# Patient Record
Sex: Male | Born: 1965 | Race: White | Hispanic: No | Smoking: Never smoker
Health system: Southern US, Community
[De-identification: ages and names within clinical notes are randomized; demographics above are authoritative.]

## PROBLEM LIST (undated history)

## (undated) VITALS — BP 128/95 | HR 89 | Temp 98.2°F | Resp 18

## (undated) DIAGNOSIS — M51369 Other intervertebral disc degeneration, lumbar region without mention of lumbar back pain or lower extremity pain: Secondary | ICD-10-CM

## (undated) DIAGNOSIS — M5136 Other intervertebral disc degeneration, lumbar region: Secondary | ICD-10-CM

## (undated) DIAGNOSIS — M5126 Other intervertebral disc displacement, lumbar region: Secondary | ICD-10-CM

## (undated) DIAGNOSIS — F419 Anxiety disorder, unspecified: Secondary | ICD-10-CM

## (undated) DIAGNOSIS — I10 Essential (primary) hypertension: Secondary | ICD-10-CM

## (undated) DIAGNOSIS — M549 Dorsalgia, unspecified: Secondary | ICD-10-CM

## (undated) DIAGNOSIS — F32A Depression, unspecified: Secondary | ICD-10-CM

---

## 2019-04-10 ENCOUNTER — Other Ambulatory Visit: Payer: Self-pay

## 2019-04-10 ENCOUNTER — Encounter (HOSPITAL_COMMUNITY): Payer: Self-pay | Admitting: Obstetrics and Gynecology

## 2019-04-10 ENCOUNTER — Emergency Department (HOSPITAL_COMMUNITY)
Admission: EM | Admit: 2019-04-10 | Discharge: 2019-04-10 | Disposition: A | Discharge status: Home / Self Care | Payer: Medicaid Other | Attending: Emergency Medicine | Admitting: Emergency Medicine

## 2019-04-10 DIAGNOSIS — I10 Essential (primary) hypertension: Secondary | ICD-10-CM | POA: Diagnosis not present

## 2019-04-10 DIAGNOSIS — F1520 Other stimulant dependence, uncomplicated: Secondary | ICD-10-CM | POA: Insufficient documentation

## 2019-04-10 DIAGNOSIS — F1510 Other stimulant abuse, uncomplicated: Secondary | ICD-10-CM

## 2019-04-10 HISTORY — DX: Other intervertebral disc displacement, lumbar region: M51.26

## 2019-04-10 HISTORY — DX: Other intervertebral disc degeneration, lumbar region without mention of lumbar back pain or lower extremity pain: M51.369

## 2019-04-10 HISTORY — DX: Other intervertebral disc degeneration, lumbar region: M51.36

## 2019-04-10 HISTORY — DX: Dorsalgia, unspecified: M54.9

## 2019-04-10 HISTORY — DX: Essential (primary) hypertension: I10

## 2019-04-10 LAB — COMPREHENSIVE METABOLIC PANEL
ALT: 12 U/L (ref 0–44)
AST: 15 U/L (ref 15–41)
Albumin: 3.9 g/dL (ref 3.5–5.0)
Alkaline Phosphatase: 86 U/L (ref 38–126)
Anion gap: 8 (ref 5–15)
BUN: 17 mg/dL (ref 6–20)
CO2: 26 mmol/L (ref 22–32)
Calcium: 8.7 mg/dL — ABNORMAL LOW (ref 8.9–10.3)
Chloride: 106 mmol/L (ref 98–111)
Creatinine, Ser: 1.28 mg/dL — ABNORMAL HIGH (ref 0.61–1.24)
GFR calc Af Amer: >60 mL/min (ref >60)
GFR calc non Af Amer: >60 mL/min (ref >60)
Glucose, Bld: 97 mg/dL (ref 70–99)
Potassium: 3.6 mmol/L (ref 3.5–5.1)
Sodium: 140 mmol/L (ref 135–145)
Total Bilirubin: 0.7 mg/dL (ref 0.3–1.2)
Total Protein: 8 g/dL (ref 6.5–8.1)

## 2019-04-10 LAB — CBC
HCT: 39.1 % (ref 39.0–52.0)
Hemoglobin: 12.5 g/dL — ABNORMAL LOW (ref 13.0–17.0)
MCH: 29.7 pg (ref 26.0–34.0)
MCHC: 32 g/dL (ref 30.0–36.0)
MCV: 92.9 fL (ref 80.0–100.0)
Platelets: 333 10*3/uL (ref 150–400)
RBC: 4.21 MIL/uL — ABNORMAL LOW (ref 4.22–5.81)
RDW: 13.7 % (ref 11.5–15.5)
WBC: 8 10*3/uL (ref 4.0–10.5)
nRBC: 0 % (ref 0.0–0.2)

## 2019-04-10 LAB — ETHANOL: Alcohol, Ethyl (B): <10 mg/dL (ref <10)

## 2019-04-10 NOTE — ED Notes (Signed)
Pt aware urine sample is needed 

## 2019-04-10 NOTE — Discharge Instructions (Addendum)
It was our pleasure to provide your ER care today - we hope that you feel better.  We made a referral to our peer support program. Also see information provided for additional community resources for rehab, shelters, etc.   Return to ER if worse, new symptoms, fevers, other concern.

## 2019-04-10 NOTE — ED Notes (Signed)
An After Visit Summary was printed and given to the patient. Discharge instructions and resources for drug abuse/rehab given to patient, patient has no further questions at this time. Patient alert and oriented x4 and ambulatory.
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
## 2019-04-10 NOTE — ED Triage Notes (Signed)
Pt reports he is from [place] and uses Meth. Pt reports he last used yesterday and has been up for 4 days. Pt reports he got some rest last night.  Pt reports he needs detox and that he has pain management doctor for morphine 30mg  pain medications for his back

## 2019-04-17 DIAGNOSIS — F1110 Opioid abuse, uncomplicated: Secondary | ICD-10-CM | POA: Insufficient documentation

## 2019-04-17 DIAGNOSIS — F1310 Sedative, hypnotic or anxiolytic abuse, uncomplicated: Secondary | ICD-10-CM | POA: Insufficient documentation

## 2019-05-31 DIAGNOSIS — F330 Major depressive disorder, recurrent, mild: Secondary | ICD-10-CM | POA: Insufficient documentation

## 2019-09-05 ENCOUNTER — Encounter (HOSPITAL_COMMUNITY): Payer: Self-pay

## 2019-09-05 ENCOUNTER — Emergency Department (HOSPITAL_COMMUNITY)
Admission: EM | Admit: 2019-09-05 | Discharge: 2019-09-06 | Disposition: A | Discharge status: Other Acute Inpatient Hospital | Payer: Medicaid Other | Source: Home / Self Care | Attending: Emergency Medicine | Admitting: Emergency Medicine

## 2019-09-05 ENCOUNTER — Emergency Department (HOSPITAL_COMMUNITY): Payer: Medicaid Other

## 2019-09-05 ENCOUNTER — Other Ambulatory Visit: Payer: Self-pay

## 2019-09-05 DIAGNOSIS — F332 Major depressive disorder, recurrent severe without psychotic features: Secondary | ICD-10-CM | POA: Insufficient documentation

## 2019-09-05 DIAGNOSIS — R45850 Homicidal ideations: Secondary | ICD-10-CM | POA: Insufficient documentation

## 2019-09-05 DIAGNOSIS — Z20828 Contact with and (suspected) exposure to other viral communicable diseases: Secondary | ICD-10-CM | POA: Insufficient documentation

## 2019-09-05 DIAGNOSIS — Z79899 Other long term (current) drug therapy: Secondary | ICD-10-CM | POA: Insufficient documentation

## 2019-09-05 DIAGNOSIS — R1013 Epigastric pain: Secondary | ICD-10-CM

## 2019-09-05 DIAGNOSIS — R1030 Lower abdominal pain, unspecified: Secondary | ICD-10-CM | POA: Insufficient documentation

## 2019-09-05 DIAGNOSIS — I10 Essential (primary) hypertension: Secondary | ICD-10-CM | POA: Insufficient documentation

## 2019-09-05 DIAGNOSIS — R45851 Suicidal ideations: Secondary | ICD-10-CM | POA: Insufficient documentation

## 2019-09-05 DIAGNOSIS — R4589 Other symptoms and signs involving emotional state: Secondary | ICD-10-CM

## 2019-09-05 LAB — RAPID URINE DRUG SCREEN, HOSP PERFORMED
Amphetamines: NOT DETECTED
Barbiturates: NOT DETECTED
Benzodiazepines: NOT DETECTED
Cocaine: NOT DETECTED
Opiates: POSITIVE — AB
Tetrahydrocannabinol: NOT DETECTED

## 2019-09-05 LAB — CBC
HCT: 46.1 % (ref 39.0–52.0)
Hemoglobin: 15.6 g/dL (ref 13.0–17.0)
MCH: 31.1 pg (ref 26.0–34.0)
MCHC: 33.8 g/dL (ref 30.0–36.0)
MCV: 91.8 fL (ref 80.0–100.0)
Platelets: 473 10*3/uL — ABNORMAL HIGH (ref 150–400)
RBC: 5.02 MIL/uL (ref 4.22–5.81)
RDW: 13.2 % (ref 11.5–15.5)
WBC: 14.7 10*3/uL — ABNORMAL HIGH (ref 4.0–10.5)
nRBC: 0 % (ref 0.0–0.2)

## 2019-09-05 LAB — URINALYSIS, ROUTINE W REFLEX MICROSCOPIC
Bacteria, UA: NONE SEEN
Bilirubin Urine: NEGATIVE
Glucose, UA: >=500 mg/dL — AB
Ketones, ur: 5 mg/dL — AB
Leukocytes,Ua: NEGATIVE
Nitrite: NEGATIVE
Protein, ur: NEGATIVE mg/dL
Specific Gravity, Urine: 1.03 (ref 1.005–1.030)
pH: 5 (ref 5.0–8.0)

## 2019-09-05 LAB — COMPREHENSIVE METABOLIC PANEL
ALT: 12 U/L (ref 0–44)
AST: 12 U/L — ABNORMAL LOW (ref 15–41)
Albumin: 4.3 g/dL (ref 3.5–5.0)
Alkaline Phosphatase: 53 U/L (ref 38–126)
Anion gap: 10 (ref 5–15)
BUN: 30 mg/dL — ABNORMAL HIGH (ref 6–20)
CO2: 28 mmol/L (ref 22–32)
Calcium: 9.4 mg/dL (ref 8.9–10.3)
Chloride: 99 mmol/L (ref 98–111)
Creatinine, Ser: 1.94 mg/dL — ABNORMAL HIGH (ref 0.61–1.24)
GFR calc Af Amer: 44 mL/min — ABNORMAL LOW (ref >60)
GFR calc non Af Amer: 38 mL/min — ABNORMAL LOW (ref >60)
Glucose, Bld: 117 mg/dL — ABNORMAL HIGH (ref 70–99)
Potassium: 4.1 mmol/L (ref 3.5–5.1)
Sodium: 137 mmol/L (ref 135–145)
Total Bilirubin: 0.9 mg/dL (ref 0.3–1.2)
Total Protein: 7.9 g/dL (ref 6.5–8.1)

## 2019-09-05 LAB — ETHANOL: Alcohol, Ethyl (B): <10 mg/dL (ref <10)

## 2019-09-05 LAB — POC SARS CORONAVIRUS 2 AG -  ED: SARS Coronavirus 2 Ag: NEGATIVE

## 2019-09-05 LAB — LIPASE, BLOOD: Lipase: 28 U/L (ref 11–51)

## 2019-09-05 MED ORDER — SODIUM CHLORIDE 0.9 % IV BOLUS
1000.0000 mL | Freq: Once | INTRAVENOUS | Status: AC
  Administered 2019-09-05: 1000 mL via INTRAVENOUS

## 2019-09-05 MED ORDER — HYDROMORPHONE HCL 1 MG/ML IJ SOLN
1.0000 mg | Freq: Once | INTRAMUSCULAR | Status: AC
  Administered 2019-09-05: 1 mg via INTRAVENOUS
  Filled 2019-09-05: qty 1

## 2019-09-05 MED ORDER — ONDANSETRON 4 MG PO TBDP
4.0000 mg | ORAL_TABLET | Freq: Once | ORAL | Status: AC
  Administered 2019-09-05: 4 mg via ORAL
  Filled 2019-09-05: qty 1

## 2019-09-05 MED ORDER — SODIUM CHLORIDE 0.9% FLUSH
3.0000 mL | Freq: Once | INTRAVENOUS | Status: AC
  Administered 2019-09-05: 3 mL via INTRAVENOUS

## 2019-09-05 MED ORDER — IOHEXOL 300 MG/ML  SOLN
100.0000 mL | Freq: Once | INTRAMUSCULAR | Status: AC | PRN
  Administered 2019-09-05: 80 mL via INTRAVENOUS

## 2019-09-05 NOTE — ED Notes (Signed)
Pt aware urine sample is needed and pt was provided a urinal.  Pt requesting we check his renal fx prior to doing CT and requested to speak with a psychiatrist due to some SI/unsafe feelings.  Advised I would let his nurse know.  RN Garnet Sierras. Notified.

## 2019-09-05 NOTE — ED Triage Notes (Addendum)
Pt c/o LLQ abd pain. Pt states that he has had nausea and diarrhea. Pt concerned for diverticulitis.

## 2019-09-05 NOTE — BH Assessment (Signed)
Clinician called pt's nurse in an attempt to talk to pt to complete their Mission Assessment, but no one picked up the phone. Will attempt again at a later time.

## 2019-09-05 NOTE — ED Provider Notes (Signed)
[facility] DEPT Provider Note   CSN: [id number] Arrival date & time: 09/05/19  1309     History   Chief Complaint Chief Complaint  Patient presents with   Abdominal Pain    HPI Tim Morales is a 53 y.o. male.     Patient presents with lower abdominal discomfort and some nausea..    The history is provided by the patient. No language interpreter was used.  Abdominal Pain Pain location:  Suprapubic Pain quality: aching   Pain radiates to:  Does not radiate Pain severity:  Moderate Onset quality:  Sudden Timing:  Constant Progression:  Worsening Chronicity:  New Context: not alcohol use   Relieved by:  Nothing Worsened by:  Nothing Ineffective treatments:  None tried Associated symptoms: no chest pain, no cough, no diarrhea, no fatigue and no hematuria     Past Medical History:  Diagnosis Date   Back pain    Bulging lumbar disc    Hypertension     There are no active problems to display for this patient.   History reviewed. No pertinent surgical history.      Home Medications    Prior to Admission medications   Medication Sig Start Date End Date Taking? Authorizing Provider  lisinopril (ZESTRIL) 10 MG tablet Take 20 mg by mouth daily. 04/23/19  Yes [provider]  QUEtiapine (SEROQUEL) 100 MG tablet Take 100 mg by mouth at bedtime.   Yes [provider]    Family History History reviewed. No pertinent family history.  Social History Social History   Tobacco Use   Smoking status: Never Smoker   Smokeless tobacco: Never Used  Substance Use Topics   Alcohol use: Yes    Comment: Social   Drug use: Yes    Types: Methamphetamines     Allergies   Patient has no known allergies.   Review of Systems Review of Systems  Constitutional: Negative for appetite change and fatigue.  HENT: Negative for congestion, ear discharge and sinus pressure.   Eyes: Negative for discharge.  Respiratory:  Negative for cough.   Cardiovascular: Negative for chest pain.  Gastrointestinal: Positive for abdominal pain. Negative for diarrhea.  Genitourinary: Negative for frequency and hematuria.  Musculoskeletal: Negative for back pain.  Skin: Negative for rash.  Neurological: Negative for seizures and headaches.  Psychiatric/Behavioral: Negative for hallucinations.     Physical Exam Updated Vital Signs BP (!) 158/111    Pulse 97    Temp 98.2 F (36.8 C) (Oral)    Resp 16    SpO2 99%   Physical Exam Vitals signs and nursing note reviewed.  Constitutional:      Appearance: He is well-developed.  HENT:     Head: Normocephalic.     Nose: Nose normal.  Eyes:     General: No scleral icterus.    Conjunctiva/sclera: Conjunctivae normal.  Neck:     Musculoskeletal: Neck supple.     Thyroid: No thyromegaly.  Cardiovascular:     Rate and Rhythm: Normal rate and regular rhythm.     Heart sounds: No murmur. No friction rub. No gallop.   Pulmonary:     Breath sounds: No stridor. No wheezing or rales.  Chest:     Chest wall: No tenderness.  Abdominal:     General: There is no distension.     Tenderness: There is abdominal tenderness. There is no rebound.  Musculoskeletal: Normal range of motion.  Lymphadenopathy:     Cervical: No cervical  adenopathy.  Skin:    Findings: No erythema or rash.  Neurological:     Mental Status: He is oriented to person, place, and time.     Motor: No abnormal muscle tone.     Coordination: Coordination normal.  Psychiatric:        Behavior: Behavior normal.      ED Treatments / Results  Labs (all labs ordered are listed, but only abnormal results are displayed) Labs Reviewed  COMPREHENSIVE METABOLIC PANEL - Abnormal; Notable for the following components:      Result Value   Glucose, Bld 117 (*)    BUN 30 (*)    Creatinine, Ser 1.94 (*)    AST 12 (*)    GFR calc non Af Amer 38 (*)    GFR calc Af Amer 44 (*)    All other components within  normal limits  CBC - Abnormal; Notable for the following components:   WBC 14.7 (*)    Platelets 473 (*)    All other components within normal limits  URINALYSIS, ROUTINE W REFLEX MICROSCOPIC - Abnormal; Notable for the following components:   Glucose, UA >=500 (*)    Hgb urine dipstick SMALL (*)    Ketones, ur 5 (*)    All other components within normal limits  LIPASE, BLOOD  ETHANOL  RAPID URINE DRUG SCREEN, HOSP PERFORMED  POC SARS CORONAVIRUS 2 AG -  ED    EKG None  Radiology Ct Abdomen Pelvis W Contrast  Result Date: 09/05/2019 CLINICAL DATA:  Left lower quadrant pain and abdominal distension EXAM: CT ABDOMEN AND PELVIS WITH CONTRAST TECHNIQUE: Multidetector CT imaging of the abdomen and pelvis was performed using the standard protocol following bolus administration of intravenous contrast. CONTRAST:  64mL OMNIPAQUE IOHEXOL 300 MG/ML  SOLN COMPARISON:  None. FINDINGS: Lower chest: Mild atelectatic changes are noted in the bases bilaterally. No sizable effusion is seen. Small sliding-type hiatal hernia is noted. Hepatobiliary: No focal liver abnormality is seen. No gallstones, gallbladder wall thickening, or biliary dilatation. Pancreas: Unremarkable. No pancreatic ductal dilatation or surrounding inflammatory changes. Spleen: Normal in size without focal abnormality. Adrenals/Urinary Tract: Adrenal glands are within normal limits. The kidneys demonstrate a normal enhancement pattern bilaterally. Delayed images demonstrate normal excretion of contrast. Small peripelvic cysts are noted on the left. No renal calculi or obstructive changes are seen. The bladder is partially distended. Stomach/Bowel: Diverticular changes noted within the colon. No definitive changes of diverticulitis are seen. The appendix is within normal limits. Small bowel shows no obstructive or inflammatory changes. The stomach is within normal limits aside from the previously mentioned hiatal hernia. Vascular/Lymphatic:  Aortic atherosclerosis. No enlarged abdominal or pelvic lymph nodes. Reproductive: Prostate is unremarkable. Other: No abdominal wall hernia or abnormality. No abdominopelvic ascites. 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 No other focal abnormality is noted. IMPRESSION: Diverticular change without evidence of diverticulitis. Chronic changes as described above without acute abnormality. Electronically Signed   By: Alcide Clever M.D.   On: 09/05/2019 19:40    Procedures Procedures (including critical care time)  Medications Ordered in ED Medications  sodium chloride flush (NS) 0.9 % injection 3 mL (3 mLs Intravenous Given 09/05/19 1709)  sodium chloride 0.9 % bolus 1,000 mL (0 mLs Intravenous Stopped 09/05/19 1834)  HYDROmorphone (DILAUDID) injection 1 mg (1 mg Intravenous Given 09/05/19 1704)  ondansetron (ZOFRAN-ODT) disintegrating tablet 4 mg (4 mg Oral Given 09/05/19 1704)  iohexol (OMNIPAQUE) 300 MG/ML solution 100 mL (  80 mLs Intravenous Contrast Given 09/05/19 1914)  HYDROmorphone (DILAUDID) injection 1 mg (1 mg Intravenous Given 09/05/19 2004)     Initial Impression / Assessment and Plan / ED Course  I have reviewed the triage vital signs and the nursing notes.  Pertinent labs & imaging results that were available during my care of the patient were reviewed by me and considered in my medical decision making (see chart for details).       Labs and CT scan unremarkable.  Patient can be discharged home with nausea medicine and told to drink fluids.  I discussed this with the patient and then he told me he was suicidal and wanted to kill himself.  And he wanted to be evaluated by psychiatry so we have consulted behavioral health  Final Clinical Impressions(s) / ED Diagnoses   Final diagnoses:  None    ED Discharge Orders    None       Bethann BerkshireZammit, Bion Todorov, MD 09/05/19 2029

## 2019-09-06 ENCOUNTER — Other Ambulatory Visit: Payer: Self-pay

## 2019-09-06 ENCOUNTER — Inpatient Hospital Stay (HOSPITAL_COMMUNITY): Payer: Medicaid Other

## 2019-09-06 ENCOUNTER — Encounter (HOSPITAL_COMMUNITY): Payer: Self-pay | Admitting: Psychiatry

## 2019-09-06 ENCOUNTER — Inpatient Hospital Stay (HOSPITAL_COMMUNITY)
Admission: AD | Admit: 2019-09-06 | Discharge: 2019-09-07 | DRG: 885 | Disposition: A | Discharge status: Home / Self Care | Payer: Medicaid Other | Source: Intra-hospital | Attending: Emergency Medicine | Admitting: Emergency Medicine

## 2019-09-06 DIAGNOSIS — F333 Major depressive disorder, recurrent, severe with psychotic symptoms: Secondary | ICD-10-CM

## 2019-09-06 DIAGNOSIS — F419 Anxiety disorder, unspecified: Secondary | ICD-10-CM | POA: Diagnosis present

## 2019-09-06 DIAGNOSIS — R197 Diarrhea, unspecified: Secondary | ICD-10-CM | POA: Diagnosis not present

## 2019-09-06 DIAGNOSIS — R112 Nausea with vomiting, unspecified: Secondary | ICD-10-CM | POA: Diagnosis not present

## 2019-09-06 DIAGNOSIS — Z79899 Other long term (current) drug therapy: Secondary | ICD-10-CM

## 2019-09-06 DIAGNOSIS — R45851 Suicidal ideations: Secondary | ICD-10-CM | POA: Diagnosis not present

## 2019-09-06 DIAGNOSIS — G4700 Insomnia, unspecified: Secondary | ICD-10-CM | POA: Diagnosis present

## 2019-09-06 DIAGNOSIS — I10 Essential (primary) hypertension: Secondary | ICD-10-CM | POA: Diagnosis present

## 2019-09-06 DIAGNOSIS — R1032 Left lower quadrant pain: Secondary | ICD-10-CM | POA: Diagnosis present

## 2019-09-06 DIAGNOSIS — R0789 Other chest pain: Secondary | ICD-10-CM | POA: Diagnosis not present

## 2019-09-06 DIAGNOSIS — Z915 Personal history of self-harm: Secondary | ICD-10-CM | POA: Diagnosis not present

## 2019-09-06 DIAGNOSIS — Z20828 Contact with and (suspected) exposure to other viral communicable diseases: Secondary | ICD-10-CM | POA: Diagnosis present

## 2019-09-06 DIAGNOSIS — R109 Unspecified abdominal pain: Secondary | ICD-10-CM | POA: Diagnosis not present

## 2019-09-06 DIAGNOSIS — F332 Major depressive disorder, recurrent severe without psychotic features: Secondary | ICD-10-CM | POA: Diagnosis not present

## 2019-09-06 LAB — CBC WITH DIFFERENTIAL/PLATELET
Abs Immature Granulocytes: 0.06 10*3/uL (ref 0.00–0.07)
Basophils Absolute: 0 10*3/uL (ref 0.0–0.1)
Basophils Relative: 0 %
Eosinophils Absolute: 0.1 10*3/uL (ref 0.0–0.5)
Eosinophils Relative: 1 %
HCT: 44.1 % (ref 39.0–52.0)
Hemoglobin: 14.7 g/dL (ref 13.0–17.0)
Immature Granulocytes: 1 %
Lymphocytes Relative: 16 %
Lymphs Abs: 1.8 10*3/uL (ref 0.7–4.0)
MCH: 30.4 pg (ref 26.0–34.0)
MCHC: 33.3 g/dL (ref 30.0–36.0)
MCV: 91.1 fL (ref 80.0–100.0)
Monocytes Absolute: 0.5 10*3/uL (ref 0.1–1.0)
Monocytes Relative: 4 %
Neutro Abs: 8.7 10*3/uL — ABNORMAL HIGH (ref 1.7–7.7)
Neutrophils Relative %: 78 %
Platelets: 420 10*3/uL — ABNORMAL HIGH (ref 150–400)
RBC: 4.84 MIL/uL (ref 4.22–5.81)
RDW: 13 % (ref 11.5–15.5)
WBC: 11.2 10*3/uL — ABNORMAL HIGH (ref 4.0–10.5)
nRBC: 0 % (ref 0.0–0.2)

## 2019-09-06 LAB — COMPREHENSIVE METABOLIC PANEL
ALT: 13 U/L (ref 0–44)
AST: 13 U/L — ABNORMAL LOW (ref 15–41)
Albumin: 4.4 g/dL (ref 3.5–5.0)
Alkaline Phosphatase: 57 U/L (ref 38–126)
Anion gap: 15 (ref 5–15)
BUN: 24 mg/dL — ABNORMAL HIGH (ref 6–20)
CO2: 22 mmol/L (ref 22–32)
Calcium: 9.1 mg/dL (ref 8.9–10.3)
Chloride: 102 mmol/L (ref 98–111)
Creatinine, Ser: 1.44 mg/dL — ABNORMAL HIGH (ref 0.61–1.24)
GFR calc Af Amer: >60 mL/min (ref >60)
GFR calc non Af Amer: 54 mL/min — ABNORMAL LOW (ref >60)
Glucose, Bld: 104 mg/dL — ABNORMAL HIGH (ref 70–99)
Potassium: 3.8 mmol/L (ref 3.5–5.1)
Sodium: 139 mmol/L (ref 135–145)
Total Bilirubin: 1.1 mg/dL (ref 0.3–1.2)
Total Protein: 8.1 g/dL (ref 6.5–8.1)

## 2019-09-06 LAB — LACTIC ACID, PLASMA
Lactic Acid, Venous: 1.7 mmol/L (ref 0.5–1.9)
Lactic Acid, Venous: 0.9 mmol/L (ref 0.5–1.9)

## 2019-09-06 LAB — URINALYSIS, ROUTINE W REFLEX MICROSCOPIC
Bacteria, UA: NONE SEEN
Bilirubin Urine: NEGATIVE
Glucose, UA: >=500 mg/dL — AB
Ketones, ur: 80 mg/dL — AB
Leukocytes,Ua: NEGATIVE
Nitrite: NEGATIVE
Protein, ur: NEGATIVE mg/dL
Specific Gravity, Urine: 1.021 (ref 1.005–1.030)
pH: 7 (ref 5.0–8.0)

## 2019-09-06 LAB — CK: Total CK: 28 U/L — ABNORMAL LOW (ref 49–397)

## 2019-09-06 LAB — LIPASE, BLOOD: Lipase: 24 U/L (ref 11–51)

## 2019-09-06 MED ORDER — SODIUM CHLORIDE 0.9 % IV BOLUS
1000.0000 mL | Freq: Once | INTRAVENOUS | Status: AC
  Administered 2019-09-06: 1000 mL via INTRAVENOUS

## 2019-09-06 MED ORDER — ACETAMINOPHEN 325 MG PO TABS
650.0000 mg | ORAL_TABLET | Freq: Once | ORAL | Status: AC
  Administered 2019-09-06: 650 mg via ORAL

## 2019-09-06 MED ORDER — TRAZODONE HCL 50 MG PO TABS: 50.0000 mg | ORAL_TABLET | Freq: Every evening | ORAL | Status: DC | PRN

## 2019-09-06 MED ORDER — AMLODIPINE BESYLATE 5 MG PO TABS
10.0000 mg | ORAL_TABLET | Freq: Once | ORAL | Status: AC
  Administered 2019-09-06: 10 mg via ORAL
  Filled 2019-09-06: qty 2

## 2019-09-06 MED ORDER — IOHEXOL 350 MG/ML SOLN
100.0000 mL | Freq: Once | INTRAVENOUS | Status: AC | PRN
  Administered 2019-09-06: 100 mL via INTRAVENOUS

## 2019-09-06 MED ORDER — ALUM & MAG HYDROXIDE-SIMETH 200-200-20 MG/5ML PO SUSP: 30.0000 mL | ORAL | Status: DC | PRN

## 2019-09-06 MED ORDER — HYDROMORPHONE HCL 1 MG/ML IJ SOLN
1.0000 mg | Freq: Once | INTRAMUSCULAR | Status: AC
  Administered 2019-09-06: 1 mg via INTRAVENOUS
  Filled 2019-09-06: qty 1

## 2019-09-06 MED ORDER — DIAZEPAM 5 MG PO TABS
5.0000 mg | ORAL_TABLET | Freq: Once | ORAL | Status: AC
  Administered 2019-09-06: 5 mg via ORAL
  Filled 2019-09-06: qty 1

## 2019-09-06 MED ORDER — ACETAMINOPHEN 325 MG PO TABS
ORAL_TABLET | ORAL | Status: AC
  Administered 2019-09-06: 04:00:00
  Filled 2019-09-06: qty 2

## 2019-09-06 MED ORDER — MIRTAZAPINE 7.5 MG PO TABS
7.5000 mg | ORAL_TABLET | Freq: Every day | ORAL | Status: DC
  Administered 2019-09-06: 7.5 mg via ORAL
  Filled 2019-09-06 (×2): qty 1

## 2019-09-06 MED ORDER — QUETIAPINE FUMARATE 50 MG PO TABS
50.0000 mg | ORAL_TABLET | Freq: Every day | ORAL | Status: DC
  Administered 2019-09-06: 50 mg via ORAL
  Filled 2019-09-06 (×2): qty 1

## 2019-09-06 MED ORDER — GABAPENTIN 100 MG PO CAPS
100.0000 mg | ORAL_CAPSULE | Freq: Three times a day (TID) | ORAL | Status: DC
  Administered 2019-09-06 (×2): 100 mg via ORAL
  Filled 2019-09-06 (×4): qty 1

## 2019-09-06 MED ORDER — QUETIAPINE FUMARATE 100 MG PO TABS
100.0000 mg | ORAL_TABLET | Freq: Once | ORAL | Status: AC
  Administered 2019-09-06: 100 mg via ORAL
  Filled 2019-09-06: qty 1

## 2019-09-06 MED ORDER — ONDANSETRON HCL 4 MG/2ML IJ SOLN
4.0000 mg | Freq: Once | INTRAMUSCULAR | Status: AC
  Administered 2019-09-06: 4 mg via INTRAVENOUS
  Filled 2019-09-06: qty 2

## 2019-09-06 MED ORDER — HYDROXYZINE HCL 25 MG PO TABS: 25.0000 mg | ORAL_TABLET | Freq: Three times a day (TID) | ORAL | Status: DC | PRN

## 2019-09-06 MED ORDER — MAGNESIUM HYDROXIDE 400 MG/5ML PO SUSP
30.0000 mL | Freq: Every day | ORAL | Status: DC | PRN
  Filled 2019-09-06: qty 30

## 2019-09-06 MED ORDER — ONDANSETRON HCL 4 MG PO TABS: 4.0000 mg | ORAL_TABLET | Freq: Three times a day (TID) | ORAL | 0 refills | Status: DC | PRN

## 2019-09-06 MED ORDER — ACETAMINOPHEN 325 MG PO TABS
650.0000 mg | ORAL_TABLET | Freq: Four times a day (QID) | ORAL | Status: DC | PRN
  Filled 2019-09-06 (×2): qty 2

## 2019-09-06 MED ORDER — LISINOPRIL 20 MG PO TABS
20.0000 mg | ORAL_TABLET | Freq: Once | ORAL | Status: AC
  Administered 2019-09-06: 20 mg via ORAL
  Filled 2019-09-06: qty 1

## 2019-09-06 NOTE — Progress Notes (Signed)
Pt transfared to [facility] by Provider due to elevated BUN and Creatinine levels. Pt to be transported via safe transport. Pt is Alert and oriented. Report given to [facility] Nurse, Nurse, adult.

## 2019-09-06 NOTE — Progress Notes (Signed)
Tim Morales is a 53 y.o. male Voluntary admitted for suicide ideation with a plan overdose on medication. Pt stated he has been having these thought since his overdose 20 yrs and became worse after the girlfriend overdosed 5 months ago and died. Pt has previous attempt by overdosing. Pt states he is currently staying with his ex wife who has separated with the husband and waiting for the divorce to go through so they can get back together. Pt stated he uses meth, pot, and opioids. Pt complained of abdominal pain 8/10 upon admission. Pt also endorses passive SI, but denied HI, AVH and verbally contracted for safety. Pt calm and cooperative during admission, alert and oriented x 4.  Consents signed, skin/belongings search completed and pt oriented to unit. Pt stable at this time. Pt given the opportunity to express concerns and ask questions. Pt given toiletries. Will continue to monitor.

## 2019-09-06 NOTE — H&P (Addendum)
Psychiatric Admission Assessment Adult  Patient Identification: Tim Morales MRN:  [id number] Date of Evaluation:  09/06/2019 Chief Complaint:  " Depressed"  Principal Diagnosis: MDD versus Depression secondary to medical illness Diagnosis:  Active Problems:   MDD (major depressive disorder), recurrent episode, severe ([place])  History of Present Illness: 53 year old male . Currently presents as fair historian Presented to ED on 12/9 for GI pain ( mainly periumbilical and lower), nausea, diarrhea. Was evaluated in ED and medically cleared. Abdominal CT reported as diverticular changes without diverticulitis. Lipase was within normal. He also reported depression, suicidal ideations of overdosing, vague auditory hallucinations, states hears voice telling him to join his GF. Describes neuro-vegetative symptoms as below. Attributes depression in part to abdominal pain, which he states worsened over the last week or so, and to death of his GF, who died from drug overdose several months ago. 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 Does state  he uses opiate analgesics " sometimes", but cannot specify further. Reports was taking " Vicodins sometimes " prior to admission ( which were not prescribed to him). 12/9 UDS positive for Opiates, which he states were recently prescribed for pain, BAL negative. I have reviewed Controlled Substance Registry with pharmacist - history of multiple opiate prescriptions - most recently Oxycodone 10 mgrs # 18 on 11/29. Suboxone 8 mgr # 14 on 10/20. Was also prescribed Xanax 0.5 mgr # 30 on 10/30. ( Patient denies recent BZD use and UDS negative for BZDs )  Associated Signs/Symptoms: Depression Symptoms:  depressed mood, anhedonia, insomnia, suicidal thoughts with specific plan, anxiety, loss of energy/fatigue, decreased appetite, (Hypo) Manic Symptoms:  None noted or reported  Anxiety  Symptoms: reports increased anxiety Psychotic Symptoms:  reports auditory hallucinations , currently not internally preoccupied, no delusions expressed, somatically focused  PTSD Symptoms: Does not currently endorse Total Time spent with patient: 45 minutes  Past Psychiatric History: Denies prior psychiatric admissions , reports suicide attempt by overdose about 4-5 months ago. Denies history of mania [place]. Endorses history of depression, which he states was aggravated by death of his GF and by his abdominal pain. Endorses auditory hallucinations recently, but denies prior history of psychosis.  Is the patient at risk to self? Yes.    Has the patient been a risk to self in the past 6 months? Yes.    Has the patient been a risk to self within the distant past? No.  Is the patient a risk to others? No.  Has the patient been a risk to others in the past 6 months? No.  Has the patient been a risk to others within the distant past? No.   Prior Inpatient Therapy:  Does not endorse  Prior Outpatient Therapy:  None currently   Alcohol Screening: 1. How often do you have a drink containing alcohol?: Monthly or less 2. How many drinks containing alcohol do you have on a typical day when you are drinking?: 1 or 2 3. How often do you have six or more drinks on one occasion?: Never AUDIT-C Score: 1 4. How often during the last year have you found that you were not able to stop drinking once you had started?: Less than monthly 5. How often during the last year have you failed to do what was normally expected from you becasue of drinking?: Never 6. How often during the last year have you needed a first drink in the morning to  get yourself going after a heavy drinking session?: Weekly 7. How often during the last year have you had a feeling of guilt of remorse after drinking?: Weekly 8. How often during the last year have you been unable to remember what happened the night before because you had  been drinking?: Less than monthly 9. Have you or someone else been injured as a result of your drinking?: No 10. Has a relative or friend or a doctor or another health worker been concerned about your drinking or suggested you cut down?: No Alcohol Use Disorder Identification Test Final Score (AUDIT): 9 Alcohol Brief Interventions/Follow-up: Brief Advice Substance Abuse History in the last 12 months: patient reports he drinks occasionally, denies pattern of alcohol abuse or dependence . Admission BAL negative.  He reports past history of methamphetamine abuse, now sober x several months. Reports history of being prescribed opiate analgesics for pain, but denies abuse or dependence and states " they only gave me 18 pills".  Consequences of Substance Abuse: Denies  Previous Psychotropic Medications: Home medication list- Seroquel 100 mgrs QHS,  Psychological Evaluations: No  Past Medical History: history of HTN, for which he takes Lisinopril 20 mgrs daily. Reports abdominal pain x several days/week.  Past Medical History:  Diagnosis Date  . Back pain   . Bulging lumbar disc   . Hypertension    History reviewed. No pertinent surgical history. Family History: parents deceased , states both died " from natural causes ". Sister committed suicide Family Psychiatric  History: denies mental illness in family- sister committed suicide  Tobacco Screening: Have you used any form of tobacco in the last 30 days? (Cigarettes, Smokeless Tobacco, Cigars, and/or Pipes): No Social History: 72, single, has two adult children, currently lives with GF. Unemployed  Social History   Substance and Sexual Activity  Alcohol Use Yes   Comment: Social     Social History   Substance and Sexual Activity  Drug Use Yes  . Types: Methamphetamines    Additional Social History:      Pain Medications: See MAR Prescriptions: See MAR Over the Counter: See MAR History of alcohol / drug use?: Yes Negative  Consequences of Use: Personal relationships Withdrawal Symptoms: Blackouts, Cramps  Allergies:  No Known Allergies Lab Results:  Results for orders placed or performed during the hospital encounter of 09/05/19 (from the past 48 hour(s))  Lipase, blood     Status: None   Collection Time: 09/05/19  1:23 PM  Result Value Ref Range   Lipase 28 11 - 51 U/L    Comment: Performed at [facility], [place] [address], [place], [place] [address]  Comprehensive metabolic panel     Status: Abnormal   Collection Time: 09/05/19  1:23 PM  Result Value Ref Range   Sodium 137 135 - 145 mmol/L   Potassium 4.1 3.5 - 5.1 mmol/L   Chloride 99 98 - 111 mmol/L   CO2 28 22 - 32 mmol/L   Glucose, Bld 117 (H) 70 - 99 mg/dL   BUN 30 (H) 6 - 20 mg/dL   Creatinine, Ser 1.94 (H) 0.61 - 1.24 mg/dL   Calcium 9.4 8.9 - 10.3 mg/dL   Total Protein 7.9 6.5 - 8.1 g/dL   Albumin 4.3 3.5 - 5.0 g/dL   AST 12 (L) 15 - 41 U/L   ALT 12 0 - 44 U/L   Alkaline Phosphatase 53 38 - 126 U/L   Total Bilirubin 0.9 0.3 - 1.2 mg/dL  GFR calc non Af Amer 38 (L) >60 mL/min   GFR calc Af Amer 44 (L) >60 mL/min   Anion gap 10 5 - 15    Comment: Performed at [facility], [place] [address] [place], [place] [address]  CBC     Status: Abnormal   Collection Time: 09/05/19  1:23 PM  Result Value Ref Range   WBC 14.7 (H) 4.0 - 10.5 K/uL   RBC 5.02 4.22 - 5.81 MIL/uL   Hemoglobin 15.6 13.0 - 17.0 g/dL   HCT 46.1 39.0 - 52.0 %   MCV 91.8 80.0 - 100.0 fL   MCH 31.1 26.0 - 34.0 pg   MCHC 33.8 30.0 - 36.0 g/dL   RDW 13.2 11.5 - 15.5 %   Platelets 473 (H) 150 - 400 K/uL   nRBC 0.0 0.0 - 0.2 %    Comment: Performed at [facility], [place] [address], [place], [place] [address]  Urinalysis, Routine w reflex microscopic     Status: Abnormal   Collection Time: 09/05/19  6:55 PM  Result Value Ref Range   Color, Urine YELLOW YELLOW   APPearance CLEAR CLEAR   Specific Gravity, Urine 1.030  1.005 - 1.030   pH 5.0 5.0 - 8.0   Glucose, UA >=500 (A) NEGATIVE mg/dL   Hgb urine dipstick SMALL (A) NEGATIVE   Bilirubin Urine NEGATIVE NEGATIVE   Ketones, ur 5 (A) NEGATIVE mg/dL   Protein, ur NEGATIVE NEGATIVE mg/dL   Nitrite NEGATIVE NEGATIVE   Leukocytes,Ua NEGATIVE NEGATIVE   RBC / HPF 0-5 0 - 5 RBC/hpf   WBC, UA 0-5 0 - 5 WBC/hpf   Bacteria, UA NONE SEEN NONE SEEN   Mucus PRESENT     Comment: Performed at [facility], [place] [address] [place], [place] [address]  Rapid urine drug screen (hospital performed)     Status: Abnormal   Collection Time: 09/05/19  6:55 PM  Result Value Ref Range   Opiates POSITIVE (A) NONE DETECTED   Cocaine NONE DETECTED NONE DETECTED   Benzodiazepines NONE DETECTED NONE DETECTED   Amphetamines NONE DETECTED NONE DETECTED   Tetrahydrocannabinol NONE DETECTED NONE DETECTED   Barbiturates NONE DETECTED NONE DETECTED    Comment: (NOTE) DRUG SCREEN FOR MEDICAL PURPOSES ONLY.  IF CONFIRMATION IS NEEDED FOR ANY PURPOSE, NOTIFY LAB WITHIN 5 DAYS. LOWEST DETECTABLE LIMITS FOR URINE DRUG SCREEN Drug Class                     Cutoff (ng/mL) Amphetamine and metabolites    1000 Barbiturate and metabolites    200 Benzodiazepine                 962 Tricyclics and metabolites     300 Opiates and metabolites        300 Cocaine and metabolites        300 THC                            50 Performed at [facility], [place] [address], [place], [place] [address]   POC SARS Coronavirus 2 Ag-ED - Nasal Swab (BD Veritor Kit)     Status: None   Collection Time: 09/05/19  8:41 PM  Result Value Ref Range   SARS Coronavirus 2 Ag NEGATIVE NEGATIVE    Comment: (NOTE) SARS-CoV-2 antigen NOT DETECTED.  Negative results are presumptive.  Negative results do not preclude  SARS-CoV-2 infection and should not be used as the sole basis for treatment or other patient management decisions, including infection  control decisions,  particularly in the presence of clinical signs and  symptoms consistent with COVID-19, or in those who have been in contact with the virus.  Negative results must be combined with clinical observations, patient history, and epidemiological information. The expected result is Negative. Fact Sheet for Patients: PodPark.tn Fact Sheet for Healthcare Providers: GiftContent.is This test is not yet approved or cleared by the [place] FDA and  has been authorized for detection and/or diagnosis of SARS-CoV-2 by FDA under an Emergency Use Authorization (EUA).  This EUA will remain in effect (meaning this test can be used) for the duration of  the COVID-19 de claration under Section 564(b)(1) of the Act, 21 U.S.C. section 360bbb-3(b)(1), unless the authorization is terminated or revoked sooner.   Ethanol     Status: None   Collection Time: 09/05/19  9:44 PM  Result Value Ref Range   Alcohol, Ethyl (B) <10 <10 mg/dL    Comment: (NOTE) Lowest detectable limit for serum alcohol is 10 mg/dL. For medical purposes only. Performed at [facility], [place] [address], [place], [place] [address]     Blood Alcohol level:  Lab Results  Component Value Date   ETH <10 09/05/2019   ETH <10 02/54/2706    Metabolic Disorder Labs:  No results found for: HGBA1C, MPG No results found for: PROLACTIN No results found for: CHOL, TRIG, HDL, CHOLHDL, VLDL, LDLCALC  Current Medications: Current Facility-Administered Medications  Medication Dose Route Frequency Provider Last Rate Last Admin  . acetaminophen (TYLENOL) tablet 650 mg  650 mg Oral Q6H PRN Anike, Adaku C, NP      . alum & mag hydroxide-simeth (MAALOX/MYLANTA) 200-200-20 MG/5ML suspension 30 mL  30 mL Oral Q4H PRN Anike, Adaku C, NP      . hydrOXYzine (ATARAX/VISTARIL) tablet 25 mg  25 mg Oral TID PRN Anike, Adaku C, NP      . magnesium hydroxide (MILK OF MAGNESIA)  suspension 30 mL  30 mL Oral Daily PRN Anike, Adaku C, NP      . traZODone (DESYREL) tablet 50 mg  50 mg Oral QHS PRN Anike, Adaku C, NP       PTA Medications: Medications Prior to Admission  Medication Sig Dispense Refill Last Dose  . lisinopril (ZESTRIL) 10 MG tablet Take 20 mg by mouth daily.     . QUEtiapine (SEROQUEL) 100 MG tablet Take 100 mg by mouth at bedtime.       Musculoskeletal: Strength & Muscle Tone: within normal limits- no significant tremors or diaphoresis, no restlessness or agitation Gait & Station: normal Patient leans: N/A  Psychiatric Specialty Exam: Physical Exam  Review of Systems  Constitutional: Negative for appetite change.  HENT: Negative.   Eyes: Negative.   Respiratory: Negative for shortness of breath.   Cardiovascular: Negative for chest pain.  Gastrointestinal: Positive for abdominal distention, abdominal pain, diarrhea and vomiting.       Reports has vomited x 2 earlier today  Endocrine: Negative.   Genitourinary: Negative.  Negative for difficulty urinating.  Musculoskeletal: Negative.   Skin: Negative.   Neurological: Negative for seizures.  Psychiatric/Behavioral: Positive for suicidal ideas.       Depression     Blood pressure (!) 122/93, pulse 100, temperature 98 F (36.7 C), temperature source Oral, resp. rate 20, height '5\' 10"'  (1.778 m), weight 80.3 kg, SpO2 100 %.Body  mass index is 25.4 kg/m.   Abdomen is tender, painful, particularly on LLQ, no rebound .   General Appearance: Fairly Groomed  Eye Contact:  Fair  Speech:  Normal Rate  Volume:  Normal  Mood:  Depressed and Dysphoric  Affect:  Congruent  Thought Process:  Linear and Descriptions of Associations: Intact  Orientation:  Other:  fully alert and attentive  Thought Content:  reports vague auditory hallucinations, does not currently present internally preoccupied, no delusions are expressed   Suicidal Thoughts:  No  Homicidal Thoughts:  No  Memory:  recent and remote  fair   Judgement:  Fair  Insight:  Fair  Psychomotor Activity:  mildly restless, uncomfortable   Concentration:  Concentration: Fair and Attention Span: Poor  Recall:  [employer] of Knowledge:  Fair  Language:  Fair  Akathisia:  Negative  Handed:  Right  AIMS (if indicated):     Assets:  Communication Skills Desire for Improvement Resilience  ADL's:  Intact  Cognition:  WNL  Sleep:       Treatment Plan Summary: Daily contact with patient to assess and evaluate symptoms and progress in treatment, Medication management, Plan inpatient treatment and medications as below  Observation Level/Precautions:  15 minute checks  Laboratory:  as needed   BMP, CBC, platelets , diff, TSH, lipid panel, HgbA1C   Psychotherapy:  Milieu, group therapy  Medications:  Patient reports had been taking  Seroquel 100 mgrs QHS, Lisinopril 20 mgrs QDAY, and Neurontin " up to 1200 mgrs daily". Unclear when he last took Seroquel /Gabapentin. Agrees to Remeron , which also may help with insomnia, appetite, nausea . Lisinopril 20 mgrs QDAY Start Remeron 7.5  mgrs QHS  Resume Seroquel 50 mgrs QHS initially For now resume Neurontin at 100 mgrs TID Presentation /history suggestive of opiate WDL symptoms. Patient attributes abdominal pain and vomiting to an underlying GI disorder, and states he does not think he is experiencing Opiate WDL .  At this time c/o dehydration  and having difficulty tolerating PO fluids due to nausea/vomiting. See below.    Consultations:  I have spoken with [facility]  ED physician. Patient reports worsening abdominal pain and currently presents uncomfortable and distressed. Symptoms may be related in part to opiate WDL. Reports he has been unable to eat x 2-3 days and states he has only been able to drink small amounts of liquid. RN reports he has declined fluids offered today . Has vomited x 2 earlier today as per his report. BUN is elevated at 30 and Creatinine is 1.94 ( up from 1.28 in July)  . CrCl has dropped to 44.4 . WBC is elevated at 14.7  Based on above presentation ED MD agrees for patient to be sent to ED for management and likely IV fluids   Discharge Concerns:  -  Estimated LOS: 4-5 days   Other:     Physician Treatment Plan for Primary Diagnosis: MDD versus Depression secondary to chronic pain Long Term Goal(s): Improvement in symptoms so as ready for discharge  Short Term Goals: Ability to identify changes in lifestyle to reduce recurrence of condition will improve, Ability to verbalize feelings will improve, Ability to disclose and discuss suicidal ideas, Ability to demonstrate self-control will improve, Ability to identify and develop effective coping behaviors will improve, Ability to maintain clinical measurements within normal limits will improve and Compliance with prescribed medications will improve  Physician Treatment Plan for Secondary Diagnosis: Suicidal Ideations Long Term Goal(s): Improvement in symptoms  so as ready for discharge  Short Term Goals: Ability to identify changes in lifestyle to reduce recurrence of condition will improve, Ability to verbalize feelings will improve, Ability to disclose and discuss suicidal ideas, Ability to demonstrate self-control will improve, Ability to identify and develop effective coping behaviors will improve, Ability to maintain clinical measurements within normal limits will improve and Compliance with prescribed medications will improve  I certify that inpatient services furnished can reasonably be expected to improve the patient's condition.    Jenne Campus, MD 12/10/202010:38 AM

## 2019-09-06 NOTE — ED Notes (Signed)
Pt reports drinks around seven beers a week

## 2019-09-06 NOTE — ED Notes (Signed)
Pt nurse in a new admission  Will call back at 8:45pm to give report

## 2019-09-06 NOTE — ED Notes (Signed)
Pt has been accepted at [facility] and can arrive at any time. This information was provided to pt's nurse, Mortimer Fries RN, at [phone].  Room: 305-1 Accepting: Talbot Grumbling, NP Attending: Dr. Parke Poisson, MD Call to Report: [phone]

## 2019-09-06 NOTE — Discharge Instructions (Signed)
The CT angiogram and bloodwork today did not show signs or explanation for the patient's abdominal pain.  I explained that we would not be prescribing opioids.  I offered zofran for nausea.  He otherwise shows improvements in his creatinine level and is medically cleared to return to behavioral health.

## 2019-09-06 NOTE — BH Assessment (Signed)
Pt has been accepted at [facility] and can arrive at any time. This information was provided to pt's nurse, Pinchus RN, at [phone].  Room: 305-1 Accepting: Adaku Anike, NP Attending: Dr. Cobos, MD Call to Report: [phone] 

## 2019-09-06 NOTE — ED Notes (Signed)
Pt said he cannot urinate at this time.

## 2019-09-06 NOTE — BHH Suicide Risk Assessment (Signed)
[facility] Admission Suicide Risk Assessment   Nursing information obtained from:  Patient Demographic factors:  Male, Divorced or widowed, Access to firearms Current Mental Status:  Suicidal ideation indicated by patient Loss Factors:  Loss of significant relationship, Decline in physical health Historical Factors:  Prior suicide attempts, Family history of suicide Risk Reduction Factors:  Employed  Total Time spent with patient: 45 minutes Principal Problem: MDD  Diagnosis:  Active Problems:   MDD (major depressive disorder), recurrent episode, severe ([facility])  Subjective Data:   Continued Clinical Symptoms:  Alcohol Use Disorder Identification Test Final Score (AUDIT): 9 The "Alcohol Use Disorders Identification Test", Guidelines for Use in Primary Care, Second Edition.  World Pharmacologist [facility]). Score between 0-7:  no or low risk or alcohol related problems. Score between 8-15:  moderate risk of alcohol related problems. Score between 16-19:  high risk of alcohol related problems. Score 20 or above:  warrants further diagnostic evaluation for alcohol dependence and treatment.   CLINICAL FACTORS:  53 year old male, presented to ED on 12 9 for GI pain.  Was medically cleared.  Also reported depression and suicidal thoughts with thoughts of overdosing, vague auditory hallucinations telling him "to join his girlfriend", neurovegetative symptoms of depression.  Attributes depression In part to worsening abdominal pain over recent days and to the death of his girlfriend (who passed away from a drug overdose several months ago).  Past history of substance abuse, mainly methamphetamine abuse.  Endorses intermittent opiate use and was recently taking Vicodin ( which was not prescribed to him)  but denies pattern of dependence.  At this time presents uncomfortable and distressed, complaining of worsening abdominal pain, nausea, vomiting    Psychiatric Specialty Exam: Physical Exam  Review of  Systems  Blood pressure (!) 122/93, pulse 100, temperature 98 F (36.7 C), temperature source Oral, resp. rate 20, height 5\' 10"  (1.778 m), weight 80.3 kg, SpO2 100 %.Body mass index is 25.4 kg/m.  See admit note MSE                                                         COGNITIVE FEATURES THAT CONTRIBUTE TO RISK:  Closed-mindedness and Loss of executive function    SUICIDE RISK:   Moderate:  Frequent suicidal ideation with limited intensity, and duration, some specificity in terms of plans, no associated intent, good self-control, limited dysphoria/symptomatology, some risk factors present, and identifiable protective factors, including available and accessible social support.  PLAN OF CARE: Patient will be admitted to inpatient psychiatric unit for stabilization and safety. Will provide and encourage milieu participation. Provide medication management and maked adjustments as needed.  Will follow daily.    I certify that inpatient services furnished can reasonably be expected to improve the patient's condition.   Jenne Campus, MD 09/06/2019, 11:31 AM

## 2019-09-06 NOTE — ED Provider Notes (Signed)
[facility]-EMERGENCY DEPT Provider Note   CSN: [id number] Arrival date & time: 09/06/19  1322     History No chief complaint on file.   Tim Morales is a 53 y.o. male.  Patient sent from behavioral health with worsening abdominal pain, nausea, vomiting, diarrhea he was seen yesterday for similar complaint.  States he had a work-up including labs and CT scan without a diagnosis.  States she has had persistent nausea and vomiting today including dry heaves and diarrhea not able to keep anything down.  No fever.  No pain with urination or blood in the urine.  No testicular pain.  Denies any illicit drug use other than marijuana which he last used 3 weeks ago.  No chest pain or shortness of breath.  No history of previous abdominal surgeries.  No history of kidney stones. He has never had this kind of pain in the past.  His drug screen yesterday was positive for opiates only.  The history is provided by the patient.       Past Medical History:  Diagnosis Date  . Back pain   . Bulging lumbar disc   . Hypertension     Patient Active Problem List   Diagnosis Date Noted  . MDD (major depressive disorder), recurrent episode, severe (HCC) 09/06/2019    History reviewed. No pertinent surgical history.     History reviewed. No pertinent family history.  Social History   Tobacco Use  . Smoking status: Never Smoker  . Smokeless tobacco: Never Used  Substance Use Topics  . Alcohol use: Yes    Comment: Social  . Drug use: Yes    Types: Methamphetamines    Home Medications Prior to Admission medications   Medication Sig Start Date End Date Taking? Authorizing Provider  lisinopril (ZESTRIL) 10 MG tablet Take 20 mg by mouth daily. 04/23/19   [provider]  QUEtiapine (SEROQUEL) 100 MG tablet Take 100 mg by mouth at bedtime.    [provider]    Allergies    Patient has no known allergies.  Review of Systems   Review of Systems   Constitutional: Positive for activity change and appetite change. Negative for fever.  HENT: Negative for congestion and rhinorrhea.   Respiratory: Negative for cough, chest tightness and shortness of breath.   Gastrointestinal: Positive for abdominal pain, diarrhea, nausea and vomiting.  Genitourinary: Negative for dysuria and hematuria.  Musculoskeletal: Positive for arthralgias and myalgias.  Skin: Negative for rash.  Neurological: Negative for dizziness, weakness and headaches.   all other systems are negative except as noted in the HPI and PMH.    Physical Exam Updated Vital Signs BP (!) 122/93 (BP Location: Right Arm)   Pulse 100   Temp 97.9 F (36.6 C) (Oral)   Resp 18   Ht 5\' 10"  (1.778 m)   Wt 80.3 kg   SpO2 98%   BMI 25.40 kg/m   Physical Exam Vitals and nursing note reviewed.  Constitutional:      General: He is not in acute distress.    Appearance: He is well-developed.     Comments: Uncomfortable, writhing around  HENT:     Head: Normocephalic and atraumatic.     Mouth/Throat:     Pharynx: No oropharyngeal exudate.  Eyes:     Conjunctiva/sclera: Conjunctivae normal.     Pupils: Pupils are equal, round, and reactive to light.  Neck:     Comments: No meningismus. Cardiovascular:     Rate  and Rhythm: Normal rate and regular rhythm.     Heart sounds: Normal heart sounds. No murmur.  Pulmonary:     Effort: Pulmonary effort is normal. No respiratory distress.     Breath sounds: Normal breath sounds.  Abdominal:     Palpations: Abdomen is soft.     Tenderness: There is abdominal tenderness. There is guarding. There is no rebound.     Comments: Left lower quadrant tenderness with voluntary guarding.  Intact femoral, DP and PT pulses.  Genitourinary:    Comments: No testicular tenderness Musculoskeletal:        General: No tenderness. Normal range of motion.     Cervical back: Normal range of motion and neck supple.  Skin:    General: Skin is warm.   Neurological:     Mental Status: He is alert and oriented to person, place, and time.     Cranial Nerves: No cranial nerve deficit.     Motor: No abnormal muscle tone.     Coordination: Coordination normal.     Comments:  5/5 strength throughout. CN 2-12 intact.Equal grip strength.   Psychiatric:        Behavior: Behavior normal.     ED Results / Procedures / Treatments   Labs (all labs ordered are listed, but only abnormal results are displayed) Labs Reviewed  COMPREHENSIVE METABOLIC PANEL - Abnormal; Notable for the following components:      Result Value   Glucose, Bld 104 (*)    BUN 24 (*)    Creatinine, Ser 1.44 (*)    AST 13 (*)    GFR calc non Af Amer 54 (*)    All other components within normal limits  CBC WITH DIFFERENTIAL/PLATELET - Abnormal; Notable for the following components:   WBC 11.2 (*)    Platelets 420 (*)    Neutro Abs 8.7 (*)    All other components within normal limits  CK - Abnormal; Notable for the following components:   Total CK 28 (*)    All other components within normal limits  URINALYSIS, ROUTINE W REFLEX MICROSCOPIC - Abnormal; Notable for the following components:   Glucose, UA >=500 (*)    Hgb urine dipstick SMALL (*)    Ketones, ur 80 (*)    All other components within normal limits  LIPASE, BLOOD  LACTIC ACID, PLASMA  LACTIC ACID, PLASMA    EKG None  Radiology CT ABDOMEN PELVIS W CONTRAST  Result Date: 09/05/2019 CLINICAL DATA:  Left lower quadrant pain and abdominal distension EXAM: CT ABDOMEN AND PELVIS WITH CONTRAST TECHNIQUE: Multidetector CT imaging of the abdomen and pelvis was performed using the standard protocol following bolus administration of intravenous contrast. CONTRAST:  43mL OMNIPAQUE IOHEXOL 300 MG/ML  SOLN COMPARISON:  None. FINDINGS: Lower chest: Mild atelectatic changes are noted in the bases bilaterally. No sizable effusion is seen. Small sliding-type hiatal hernia is noted. Hepatobiliary: No focal liver  abnormality is seen. No gallstones, gallbladder wall thickening, or biliary dilatation. Pancreas: Unremarkable. No pancreatic ductal dilatation or surrounding inflammatory changes. Spleen: Normal in size without focal abnormality. Adrenals/Urinary Tract: Adrenal glands are within normal limits. The kidneys demonstrate a normal enhancement pattern bilaterally. Delayed images demonstrate normal excretion of contrast. Small peripelvic cysts are noted on the left. No renal calculi or obstructive changes are seen. The bladder is partially distended. Stomach/Bowel: Diverticular changes noted within the colon. No definitive changes of diverticulitis are seen. The appendix is within normal limits. Small bowel shows no obstructive or  inflammatory changes. The stomach is within normal limits aside from the previously mentioned hiatal hernia. Vascular/Lymphatic: Aortic atherosclerosis. No enlarged abdominal or pelvic lymph nodes. Reproductive: Prostate is unremarkable. Other: No abdominal wall hernia or abnormality. No abdominopelvic ascites. 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 No other focal abnormality is noted. IMPRESSION: Diverticular change without evidence of diverticulitis. Chronic changes as described above without acute abnormality. Electronically Signed   By: Alcide Clever M.D.   On: 09/05/2019 19:40    Procedures Procedures (including critical care time)  Medications Ordered in ED Medications  acetaminophen (TYLENOL) tablet 650 mg (has no administration in time range)  alum & mag hydroxide-simeth (MAALOX/MYLANTA) 200-200-20 MG/5ML suspension 30 mL (has no administration in time range)  magnesium hydroxide (MILK OF MAGNESIA) suspension 30 mL (has no administration in time range)  QUEtiapine (SEROQUEL) tablet 50 mg (has no administration in time range)  mirtazapine (REMERON) tablet 7.5 mg (has no administration in time range)  gabapentin (NEURONTIN)  capsule 100 mg (100 mg Oral Given 09/06/19 1253)  sodium chloride 0.9 % bolus 1,000 mL (has no administration in time range)  sodium chloride 0.9 % bolus 1,000 mL (has no administration in time range)  ondansetron (ZOFRAN) injection 4 mg (has no administration in time range)  HYDROmorphone (DILAUDID) injection 1 mg (has no administration in time range)    ED Course  I have reviewed the triage vital signs and the nursing notes.  Pertinent labs & imaging results that were available during my care of the patient were reviewed by me and considered in my medical decision making (see chart for details).  Clinical Course as of Sep 05 1799  Thu Sep 06, 2019  1648 Pt signed out to me by Dr. Manus Gunning.  Briefly 53 yo male presenting as referral from [facility] with concern for "dehydration."  Patient with abd tenderness and guarding, mostly LLQ, pending CTA abd    [MT]    Clinical Course User Index [MT] Trifan, Kermit Balo, MD   MDM Rules/Calculators/A&P     CHA2DS2/VAS Stroke Risk Points      N/A >= 2 Points: High Risk  1 - 1.99 Points: Medium Risk  0 Points: Low Risk    A final score could not be computed because of missing components.: Last  Change: N/A     This score determines the patient's risk of having a stroke if the  patient has atrial fibrillation.      This score is not applicable to this patient. Components are not  calculated.                   Persistent left lower quadrant abdominal pain with nausea, vomiting and diarrhea since yesterday.  No fever.  CT scan yesterday reviewed.  It showed diverticulosis without evidence of diverticulitis.  IVF and symptom control given.  UA with hematuria, large ketones. Normal anion gap. Aggressive IVF given.  Lactate and CK normal. Cr improved from yesterday.  Unclear etiology of patient's LLQ Pain. Does appear distressed and dehydrated with vomiting. No testicular pain.  CT yesterday reassuring. With ongoing patient and persistent pain, will  repeat CT as angiogram to r/o an evidence of mesenteric ischemia or other new pathology.  Additional IVF given.  UDS yesterday positive only for opiates.   If CT reassuring and symptoms controlled, patient may be able to go back to [facility].  Dr. Renaye Rakers to assume care at shift change. Final Clinical Impression(s) / ED Diagnoses Final  diagnoses:  None    Rx / DC Orders ED Discharge Orders    None       Mittie Knittel, Jeannett Senior, MD 09/06/19 [phone]

## 2019-09-06 NOTE — Tx Team (Signed)
Initial Treatment Plan 09/06/2019 5:51 AM Tim Morales [id number]    PATIENT STRESSORS: Health problems Loss of sister and girl friend through overdose. Marital or family conflict Substance abuse   PATIENT STRENGTHS: Scientist, research (life sciences) Supportive family/friends Work skills   PATIENT IDENTIFIED PROBLEMS: Depression  Anxiety  "coping skills"  "Anger management"               DISCHARGE CRITERIA:  Ability to meet basic life and health needs Improved stabilization in mood, thinking, and/or behavior Medical problems require only outpatient monitoring Motivation to continue treatment in a less acute level of care Verbal commitment to aftercare and medication compliance  PRELIMINARY DISCHARGE PLAN: Attend aftercare/continuing care group Attend PHP/IOP Attend 12-step recovery group Outpatient therapy Return to previous work or school arrangements  PATIENT/FAMILY INVOLVEMENT: This treatment plan has been presented to and reviewed with the patient, Tim Morales, and/or family member.  The patient and family have been given the opportunity to ask questions and make suggestions.  Wolfgang Phoenix, RN 09/06/2019, 5:51 AM
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
## 2019-09-07 ENCOUNTER — Inpatient Hospital Stay (HOSPITAL_COMMUNITY): Admit: 2019-09-07 | Payer: Medicaid Other | Admitting: Psychiatry

## 2019-09-07 ENCOUNTER — Inpatient Hospital Stay (HOSPITAL_COMMUNITY)
Admission: AD | Admit: 2019-09-07 | Discharge: 2019-09-08 | Discharge status: Against Medical Advice | Payer: Medicaid Other | Source: Intra-hospital | Attending: Psychiatry | Admitting: Psychiatry

## 2019-09-07 ENCOUNTER — Other Ambulatory Visit: Payer: Self-pay

## 2019-09-07 DIAGNOSIS — Z20828 Contact with and (suspected) exposure to other viral communicable diseases: Secondary | ICD-10-CM | POA: Diagnosis present

## 2019-09-07 DIAGNOSIS — N179 Acute kidney failure, unspecified: Secondary | ICD-10-CM | POA: Diagnosis not present

## 2019-09-07 DIAGNOSIS — T508X5A Adverse effect of diagnostic agents, initial encounter: Secondary | ICD-10-CM | POA: Diagnosis not present

## 2019-09-07 DIAGNOSIS — F332 Major depressive disorder, recurrent severe without psychotic features: Secondary | ICD-10-CM | POA: Diagnosis present

## 2019-09-07 DIAGNOSIS — R45851 Suicidal ideations: Secondary | ICD-10-CM | POA: Diagnosis present

## 2019-09-07 DIAGNOSIS — I10 Essential (primary) hypertension: Secondary | ICD-10-CM | POA: Diagnosis present

## 2019-09-07 DIAGNOSIS — D649 Anemia, unspecified: Secondary | ICD-10-CM | POA: Diagnosis present

## 2019-09-07 DIAGNOSIS — G8929 Other chronic pain: Secondary | ICD-10-CM | POA: Diagnosis present

## 2019-09-07 DIAGNOSIS — F1590 Other stimulant use, unspecified, uncomplicated: Secondary | ICD-10-CM | POA: Diagnosis present

## 2019-09-07 DIAGNOSIS — I959 Hypotension, unspecified: Secondary | ICD-10-CM | POA: Diagnosis not present

## 2019-09-07 DIAGNOSIS — F1123 Opioid dependence with withdrawal: Secondary | ICD-10-CM | POA: Diagnosis present

## 2019-09-07 DIAGNOSIS — M549 Dorsalgia, unspecified: Secondary | ICD-10-CM | POA: Diagnosis present

## 2019-09-07 DIAGNOSIS — G4700 Insomnia, unspecified: Secondary | ICD-10-CM | POA: Diagnosis present

## 2019-09-07 DIAGNOSIS — K5790 Diverticulosis of intestine, part unspecified, without perforation or abscess without bleeding: Secondary | ICD-10-CM | POA: Diagnosis present

## 2019-09-07 DIAGNOSIS — E860 Dehydration: Secondary | ICD-10-CM | POA: Diagnosis not present

## 2019-09-07 MED ORDER — METHOCARBAMOL 500 MG PO TABS
500.0000 mg | ORAL_TABLET | Freq: Three times a day (TID) | ORAL | Status: DC | PRN
  Administered 2019-09-07: 500 mg via ORAL
  Filled 2019-09-07 (×2): qty 1

## 2019-09-07 MED ORDER — NAPROXEN 500 MG PO TABS: 500.0000 mg | ORAL_TABLET | Freq: Two times a day (BID) | ORAL | Status: DC | PRN

## 2019-09-07 MED ORDER — SUCRALFATE 1 G PO TABS
1.0000 g | ORAL_TABLET | Freq: Three times a day (TID) | ORAL | Status: DC
  Administered 2019-09-07 – 2019-09-10 (×10): 1 g via ORAL
  Filled 2019-09-07 (×19): qty 1

## 2019-09-07 MED ORDER — CLONIDINE HCL 0.1 MG PO TABS
0.1000 mg | ORAL_TABLET | ORAL | Status: DC
  Filled 2019-09-07 (×2): qty 1

## 2019-09-07 MED ORDER — DICYCLOMINE HCL 20 MG PO TABS
20.0000 mg | ORAL_TABLET | Freq: Four times a day (QID) | ORAL | Status: DC | PRN
  Administered 2019-09-07 – 2019-09-09 (×4): 20 mg via ORAL
  Filled 2019-09-07 (×4): qty 1

## 2019-09-07 MED ORDER — ALUM & MAG HYDROXIDE-SIMETH 200-200-20 MG/5ML PO SUSP: 30.0000 mL | ORAL | Status: DC | PRN

## 2019-09-07 MED ORDER — MIRTAZAPINE 7.5 MG PO TABS
7.5000 mg | ORAL_TABLET | Freq: Every day | ORAL | Status: DC
  Filled 2019-09-07: qty 1

## 2019-09-07 MED ORDER — TRAZODONE HCL 50 MG PO TABS: 50.0000 mg | ORAL_TABLET | Freq: Every evening | ORAL | Status: DC | PRN

## 2019-09-07 MED ORDER — QUETIAPINE FUMARATE 100 MG PO TABS: 100.0000 mg | ORAL_TABLET | Freq: Every day | ORAL | Status: DC

## 2019-09-07 MED ORDER — HYDROXYZINE HCL 25 MG PO TABS: 25.0000 mg | ORAL_TABLET | Freq: Three times a day (TID) | ORAL | Status: DC | PRN

## 2019-09-07 MED ORDER — ONDANSETRON 4 MG PO TBDP
4.0000 mg | ORAL_TABLET | Freq: Four times a day (QID) | ORAL | Status: DC | PRN
  Administered 2019-09-07 – 2019-09-09 (×3): 4 mg via ORAL
  Filled 2019-09-07 (×3): qty 1

## 2019-09-07 MED ORDER — VITAMIN B-1 100 MG PO TABS
100.0000 mg | ORAL_TABLET | Freq: Every day | ORAL | Status: DC
  Administered 2019-09-08 – 2019-09-12 (×5): 100 mg via ORAL
  Filled 2019-09-07 (×7): qty 1

## 2019-09-07 MED ORDER — LOPERAMIDE HCL 2 MG PO CAPS: 2.0000 mg | ORAL_CAPSULE | ORAL | Status: AC | PRN

## 2019-09-07 MED ORDER — LORAZEPAM 1 MG PO TABS
1.0000 mg | ORAL_TABLET | Freq: Four times a day (QID) | ORAL | Status: AC | PRN
  Administered 2019-09-07 – 2019-09-08 (×4): 1 mg via ORAL
  Filled 2019-09-07 (×4): qty 1

## 2019-09-07 MED ORDER — MAGNESIUM HYDROXIDE 400 MG/5ML PO SUSP: 30.0000 mL | Freq: Every day | ORAL | Status: DC | PRN

## 2019-09-07 MED ORDER — ONDANSETRON 4 MG PO TBDP: 4.0000 mg | ORAL_TABLET | Freq: Four times a day (QID) | ORAL | Status: DC | PRN

## 2019-09-07 MED ORDER — CLONIDINE HCL 0.1 MG PO TABS
0.1000 mg | ORAL_TABLET | Freq: Four times a day (QID) | ORAL | Status: DC
  Administered 2019-09-07 (×3): 0.1 mg via ORAL
  Filled 2019-09-07 (×8): qty 1

## 2019-09-07 MED ORDER — ADULT MULTIVITAMIN W/MINERALS CH
1.0000 | ORAL_TABLET | Freq: Every day | ORAL | Status: DC
  Administered 2019-09-07 – 2019-09-12 (×6): 1 via ORAL
  Filled 2019-09-07 (×8): qty 1

## 2019-09-07 MED ORDER — HYDROXYZINE HCL 25 MG PO TABS: 25.0000 mg | ORAL_TABLET | Freq: Four times a day (QID) | ORAL | Status: DC | PRN

## 2019-09-07 MED ORDER — PANTOPRAZOLE SODIUM 40 MG PO TBEC
40.0000 mg | DELAYED_RELEASE_TABLET | Freq: Every day | ORAL | Status: DC
  Administered 2019-09-07 – 2019-09-08 (×2): 40 mg via ORAL
  Filled 2019-09-07 (×4): qty 1

## 2019-09-07 MED ORDER — ACETAMINOPHEN 325 MG PO TABS: 650.0000 mg | ORAL_TABLET | Freq: Four times a day (QID) | ORAL | Status: DC | PRN

## 2019-09-07 MED ORDER — THIAMINE HCL 100 MG/ML IJ SOLN
100.0000 mg | Freq: Once | INTRAMUSCULAR | Status: AC
  Administered 2019-09-07: 100 mg via INTRAMUSCULAR
  Filled 2019-09-07: qty 2

## 2019-09-07 MED ORDER — CLONIDINE HCL 0.1 MG PO TABS: 0.1000 mg | ORAL_TABLET | Freq: Every day | ORAL | Status: DC

## 2019-09-07 MED ORDER — LISINOPRIL 20 MG PO TABS
20.0000 mg | ORAL_TABLET | Freq: Every day | ORAL | Status: DC
  Administered 2019-09-07: 20 mg via ORAL
  Filled 2019-09-07 (×3): qty 1

## 2019-09-07 MED ORDER — LOPERAMIDE HCL 2 MG PO CAPS: 2.0000 mg | ORAL_CAPSULE | ORAL | Status: DC | PRN

## 2019-09-07 MED ORDER — HYDROXYZINE HCL 25 MG PO TABS
25.0000 mg | ORAL_TABLET | Freq: Three times a day (TID) | ORAL | Status: DC | PRN
  Administered 2019-09-08 – 2019-09-11 (×3): 25 mg via ORAL
  Filled 2019-09-07 (×3): qty 1

## 2019-09-07 NOTE — BHH Suicide Risk Assessment (Signed)
[facility] Admission Suicide Risk Assessment   Nursing information obtained from:   patient and chart  Demographic factors:   78 y old male  Current Mental Status:   see below Loss Factors:   death of GF  Historical Factors:   no prior psychiatric admissions, suicide attempt by overdose 5 months ago Risk Reduction Factors:   resilience   Total Time spent with patient: 45 minutes Principal Problem: MDD Diagnosis:  Active Problems:   * No active hospital problems. *  Subjective Data:   Continued Clinical Symptoms:    The "Alcohol Use Disorders Identification Test", Guidelines for Use in Primary Care, Second Edition.  World Pharmacologist [facility]). Score between 0-7:  no or low risk or alcohol related problems. Score between 8-15:  moderate risk of alcohol related problems. Score between 16-19:  high risk of alcohol related problems. Score 20 or above:  warrants further diagnostic evaluation for alcohol dependence and treatment.   CLINICAL FACTORS:  53 year old male, was admitted for depression, suicidal ideations, thoughts of overdosing.  Presented with severe abdominal pain, poor p.o. intake, elevated creatinine and BUN.  Because of this was transferred to ED where he was reevaluated and medically cleared (work-up included angio abdominal CT).  Creatinine trending down from 1.94-1.44. Patient denies opiate abuse but acknowledges intermittent opiate analgesic use.  [place] controlled substance registry shows a long history of opiate analgesic prescriptions. Today he continues to describe abdominal pain but appears less uncomfortable and is less distressed than yesterday. Symptoms (nausea, vomiting, abdominal pain/cramps, restlessness) are suggestive of opiate withdraFeatureswal.  BP 137/103 (patient has a history of hypertension), pulse 106.  He denies alcohol or benzodiazepine abuse and states last took Xanax several weeks ago.  Admission BAL negative, admission UDS negative for  benzos.   Psychiatric Specialty Exam: Physical Exam  Review of Systems  Blood pressure (!) 137/103, pulse (!) 106, temperature 98.2 F (36.8 C), temperature source Oral, resp. rate 18.There is no height or weight on file to calculate BMI.  See admit note MSE   COGNITIVE FEATURES THAT CONTRIBUTE TO RISK:  Closed-mindedness and Loss of executive function    SUICIDE RISK:   Moderate:  Frequent suicidal ideation with limited intensity, and duration, some specificity in terms of plans, no associated intent, good self-control, limited dysphoria/symptomatology, some risk factors present, and identifiable protective factors, including available and accessible social support.  PLAN OF CARE: Patient will be admitted to inpatient psychiatric unit for stabilization and safety. Will provide and encourage milieu participation. Provide medication management and maked adjustments as needed. Will also provide medication management to address WDL symptoms as needed .  Will follow daily.    I certify that inpatient services furnished can reasonably be expected to improve the patient's condition.   Jenne Campus, MD 09/07/2019, 10:40 AM

## 2019-09-07 NOTE — ED Notes (Signed)
Pt d/c and safe transport called. Charge notified and [facility] informed  Arrangements confirmed. Provider notified and pt is D/c per provider order  Pt informed of d/c  Security notified

## 2019-09-07 NOTE — Tx Team (Signed)
Interdisciplinary Treatment and Diagnostic Plan Update  09/07/2019 Time of Session: Tim Morales MRN: [id number]  Principal Diagnosis: <principal problem not specified>  Secondary Diagnoses: Active Problems:   * No active hospital problems. *   Current Medications:  No current facility-administered medications for this encounter.   PTA Medications: Medications Prior to Admission  Medication Sig Dispense Refill Last Dose  . lisinopril (ZESTRIL) 10 MG tablet Take 20 mg by mouth daily.     . ondansetron (ZOFRAN) 4 MG tablet Take 1 tablet (4 mg total) by mouth every 8 (eight) hours as needed for up to 12 doses for nausea or vomiting. 12 tablet 0   . QUEtiapine (SEROQUEL) 100 MG tablet Take 100 mg by mouth at bedtime.       Patient Stressors:    Patient Strengths:    Treatment Modalities: Medication Management, Group therapy, Case management,  1 to 1 session with clinician, Psychoeducation, Recreational therapy.   Physician Treatment Plan for Primary Diagnosis: <principal problem not specified> Long Term Goal(s):     Short Term Goals:    Medication Management: Evaluate patient's response, side effects, and tolerance of medication regimen.  Therapeutic Interventions: 1 to 1 sessions, Unit Group sessions and Medication administration.  Evaluation of Outcomes: Not Met  Physician Treatment Plan for Secondary Diagnosis: Active Problems:   * No active hospital problems. *  Long Term Goal(s):     Short Term Goals:       Medication Management: Evaluate patient's response, side effects, and tolerance of medication regimen.  Therapeutic Interventions: 1 to 1 sessions, Unit Group sessions and Medication administration.  Evaluation of Outcomes: Not Met   RN Treatment Plan for Primary Diagnosis: <principal problem not specified> Long Term Goal(s): Knowledge of disease and therapeutic regimen to maintain health will improve  Short Term Goals: Ability to participate in decision  making will improve, Ability to verbalize feelings will improve, Ability to disclose and discuss suicidal ideas, Ability to identify and develop effective coping behaviors will improve and Compliance with prescribed medications will improve  Medication Management: RN will administer medications as ordered by provider, will assess and evaluate patient's response and provide education to patient for prescribed medication. RN will report any adverse and/or side effects to prescribing provider.  Therapeutic Interventions: 1 on 1 counseling sessions, Psychoeducation, Medication administration, Evaluate responses to treatment, Monitor vital signs and CBGs as ordered, Perform/monitor CIWA, COWS, AIMS and Fall Risk screenings as ordered, Perform wound care treatments as ordered.  Evaluation of Outcomes: Not Met   LCSW Treatment Plan for Primary Diagnosis: <principal problem not specified> Long Term Goal(s): Safe transition to appropriate next level of care at discharge, Engage patient in therapeutic group addressing interpersonal concerns.  Short Term Goals: Engage patient in aftercare planning with referrals and resources  Therapeutic Interventions: Assess for all discharge needs, 1 to 1 time with Social worker, Explore available resources and support systems, Assess for adequacy in community support network, Educate family and significant other(s) on suicide prevention, Complete Psychosocial Assessment, Interpersonal group therapy.  Evaluation of Outcomes: Not Met   Progress in Treatment: Attending groups: No. Participating in groups: No. Taking medication as prescribed: Yes. Toleration medication: Yes. Family/Significant other contact made: No, will contact:  if patient consents to collateral contacts Patient understands diagnosis: Yes. Discussing patient identified problems/goals with staff: Yes. Medical problems stabilized or resolved: Yes. Denies suicidal/homicidal ideation:  Yes. Issues/concerns per patient self-inventory: No. Other:   New problem(s) identified: None   New Short Term/Long Term Goal(s): Detox,  medication stabilization, elimination of SI thoughts, development of comprehensive mental wellness plan.    Patient Goals:    Discharge Plan or Barriers: Patient recently admitted. CSW will continue to follow and assess for appropriate referrals and possible discharge planning.    Reason for Continuation of Hospitalization: Anxiety Depression Hallucinations Medication stabilization Suicidal ideation  Estimated Length of Stay:  Attendees: Patient: 09/07/2019 10:23 AM  Physician: Dr. Neita Garnet, MD 09/07/2019 10:23 AM  Nursing: Rise Paganini.Raliegh Ip, RN 09/07/2019 10:23 AM  RN Care Manager: 09/07/2019 10:23 AM  Social Worker: Radonna Ricker, LCSW 09/07/2019 10:23 AM  Recreational Therapist:  09/07/2019 10:23 AM  Other:  09/07/2019 10:23 AM  Other:  09/07/2019 10:23 AM  Other: 09/07/2019 10:23 AM    Scribe for Treatment Team: [name], [place] 09/07/2019 10:23 AM

## 2019-09-07 NOTE — BHH Group Notes (Signed)
09/07/2019 8:45am Type of Group and Topic: Psychoeducational Group: Discharge Planning  Participation Level: Did Not Attend  Description of Group Discharge planning group reviews patient's anticipated discharge plans and assists patients to anticipate and address any barriers to wellness/recovery in the community. Suicide prevention education is reviewed with patients in group. Therapeutic Goals 1. Patients will state their anticipated discharge plan and mental health aftercare 2. Patients will identify potential barriers to wellness in the community setting 3. Patients will engage in problem solving, solution focused discussion of ways to anticipate and address barriers to wellness/recovery   Summary of Patient Progress Plan for Discharge/Comments:  Invited, chose not to attend.     Radonna Ricker, MSW, [place] Worker [facility]  Phone: [phone] 09/07/2019 1:59 PM

## 2019-09-07 NOTE — ED Notes (Signed)
Pt angry and upset and refuses to have his bp checked

## 2019-09-07 NOTE — Progress Notes (Signed)
Medications administered per MD orders.  Emotional support and encouragement given patient.  Safety maintained with 15 minute checks.  

## 2019-09-07 NOTE — ED Notes (Signed)
Called AC at [facility]  Pt needs a lower bp to accept

## 2019-09-07 NOTE — ED Notes (Signed)
Per Conversation the Dr Tim Morales. Tim Morales Tim Morales has been examined treated and is medically stable and return to [facility] for the remainder of his care

## 2019-09-07 NOTE — Progress Notes (Signed)
Patient ID: Tim Morales, male   DOB: [birth year], 53 y.o.   MRN: [id number] Pt returned from ED to 300 hall. Pt c/o arm hurting and dizziness when vital signs was taken. Escorted pt to his room. Pt allowed to rest and attempted to retake the vitals again. Pt declined to have vitals signs taken.

## 2019-09-07 NOTE — Progress Notes (Signed)
Recreation Therapy Notes  Date:  12.11.20 Time: 0930 Location: [address] Dayroom  Group Topic: Stress Management  Goal Area(s) Addresses:  Patient will identify positive stress management techniques. Patient will identify benefits of using stress management post d/c.  Intervention: Stress Management  Activity :  Meditation.  LRT played a meditation that focused on choices.  Patients were to follow along and listen as meditation played to fully engage in activity.  Education:  Stress Management, Discharge Planning.   Education Outcome: Acknowledges Education  Clinical Observations/Feedback: Pt did not attend group activity.    Elisea Khader, LRT/CTRS         Roosevelt Bisher A 09/07/2019 11:16 AM 

## 2019-09-07 NOTE — ED Provider Notes (Signed)
I was notified by the nursing staff that after the patient was being discharged for his abdominal pain, that he subsequently had developed chest pain.  This raise concern as he was not complaining of chest pain earlier, and he began asking again for pain medicine.  I did give him some blood pressure lowering medicine, as it was made clear to [place] that he would not be accepted back at behavioral health with an elevated blood pressure.  I gave him lisinopril as well as amlodipine, as well as a dose of Valium, as I believe his elevated blood pressure may be related to his increasing agitation.  I checked an EKG which did not show signs of ischemia.  I have very low concern for a developing coronary event.  Nearly 2 hours later, I was notified by the nursing staff the patient is now refusing to allow blood pressure to be obtained.  He now reports to [place] that he is suicidal.  I find this behavior suspicious and highly concerning for malingering, given his evolution of symptoms during his stay here.  Explained to the patient if he would not cooperate with [place] will be asked to leave.  He will be escorted outside.   Wyvonnia Dusky, MD 09/07/19 [phone]

## 2019-09-07 NOTE — H&P (Signed)
Psychiatric Admission Assessment Adult  Patient Identification: Tim Morales MRN:  [id number] Date of Evaluation:  09/07/2019 Chief Complaint:  Depression Principal Diagnosis: MDD  Diagnosis:  Active Problems:   * No active hospital problems. *  History of Present Illness: 40 y old male, recently admitted due to depression, suicidal ideations with thoughts of overdosing- please refer to my full HP from yesterday 12/10 as well . Patient had initially presented to ED reporting abdominal pain , had also reported depression and SI. Was worked up for abdominal pain and medically cleared in ED .  On unit presented uncomfortable and distressed with lower abdominal pain and reported repeated vomiting and inability to eat /drink . Creatinine / BUN were noted to be elevated and patient was sent to ED for medical re-evaluation/hydration. Creatinine improved to 1.44, BUN improved to 24, WBC improved to 11.2 and abdominal angio CT was done ( had had another abdominal CT 2 days prior) . Read as changes of diverticulosis without evidence of diverticulitis . Stable appearance of abdomen and pelvis. No changes compared to prior . ED notes indicate that upon work up of abdominal pain /medical clearance he also complained of chest pain. Was medically cleared in ED and returned to [facility]. Patient's UDS (+) for opiates . Reports intermittent use of opiate analgesics and review of [place] controlled substance registry shows history of multiple opiate analgesic prescriptions , most recently on 11/29 . He also had been prescribed Xanax, but states he has not used Xanax in more than a month. Today remains dysphoric, vaguely irritable, complaining of abdominal pain, but appearing less uncomfortable and less agitated than yesterday.  Endorses passive SI but contracts for safety on unit      Associated Signs/Symptoms: Depression Symptoms:  depressed mood, anhedonia, insomnia, suicidal thoughts with specific  plan, anxiety, loss of energy/fatigue, decreased appetite, (Hypo) Manic Symptoms:  Some irritability Anxiety Symptoms:  Reports increased anxiety Psychotic Symptoms:  Today does not report hallucinations and does not appear internally preoccupied  PTSD Symptoms: None endorsed  Total Time spent with patient: 45 minutes  Past Psychiatric History:Denies prior psychiatric admissions , reports suicide attempt by overdose about 4-5 months ago. Denies history of mania [place]. Endorses history of depression, which he states was aggravated by death of his GF and by his abdominal pain. Endorses auditory hallucinations recently, but denies prior history of psychosis.  Is the patient at risk to self? Yes.    Has the patient been a risk to self in the past 6 months? Yes.    Has the patient been a risk to self within the distant past? No.  Is the patient a risk to others? No.  Has the patient been a risk to others in the past 6 months? No.  Has the patient been a risk to others within the distant past? No.   Prior Inpatient Therapy:  No  Prior Outpatient Therapy:  No   Alcohol Screening:   Substance Abuse History in the last 12 months: denies alcohol abuse, currently reports was drinking 1-2 beers a few times a week. As above reports he had not taken Xanax for several weeks prior to admission. Denies Opiate Abuse or Dependence, but endorses intermittent opiate use , and [place] registry review shows history of multiple opiate prescriptions over the last year .   Consequences of Substance Abuse: Denies  Previous Psychotropic Medications: was prescribed Seroquel  Psychological Evaluations: No  Past Medical History: history of HTN, reports abdominal pain started about 2 weeks ago  Past Medical History:  Diagnosis Date  . Back pain   . Bulging lumbar disc   . Hypertension    No past surgical history on file. Family History: parents deceased , states both died " from natural causes ". Sister  committed suicide Family Psychiatric  History: denies mental illness in family- sister committed suicide  Tobacco Screening: Have you used any form of tobacco in the last 30 days? (Cigarettes, Smokeless Tobacco, Cigars, and/or Pipes): No Social History: 30, single, has two adult children, currently lives with GF. Unemployed  Social History   Substance and Sexual Activity  Alcohol Use Yes   Comment: Social     Social History   Substance and Sexual Activity  Drug Use Yes  . Types: Methamphetamines    Additional Social History:  Allergies:  No Known Allergies Lab Results:  Results for orders placed or performed during the hospital encounter of 09/06/19 (from the past 48 hour(s))  Comprehensive metabolic panel     Status: Abnormal   Collection Time: 09/06/19  3:19 PM  Result Value Ref Range   Sodium 139 135 - 145 mmol/L   Potassium 3.8 3.5 - 5.1 mmol/L   Chloride 102 98 - 111 mmol/L   CO2 22 22 - 32 mmol/L   Glucose, Bld 104 (H) 70 - 99 mg/dL   BUN 24 (H) 6 - 20 mg/dL   Creatinine, Ser 1.44 (H) 0.61 - 1.24 mg/dL   Calcium 9.1 8.9 - 10.3 mg/dL   Total Protein 8.1 6.5 - 8.1 g/dL   Albumin 4.4 3.5 - 5.0 g/dL   AST 13 (L) 15 - 41 U/L   ALT 13 0 - 44 U/L   Alkaline Phosphatase 57 38 - 126 U/L   Total Bilirubin 1.1 0.3 - 1.2 mg/dL   GFR calc non Af Amer 54 (L) >60 mL/min   GFR calc Af Amer >60 >60 mL/min   Anion gap 15 5 - 15    Comment: Performed at [facility], [place] [address], [place], [place] [address]  CBC with Differential     Status: Abnormal   Collection Time: 09/06/19  3:19 PM  Result Value Ref Range   WBC 11.2 (H) 4.0 - 10.5 K/uL   RBC 4.84 4.22 - 5.81 MIL/uL   Hemoglobin 14.7 13.0 - 17.0 g/dL   HCT 44.1 39.0 - 52.0 %   MCV 91.1 80.0 - 100.0 fL   MCH 30.4 26.0 - 34.0 pg   MCHC 33.3 30.0 - 36.0 g/dL   RDW 13.0 11.5 - 15.5 %   Platelets 420 (H) 150 - 400 K/uL   nRBC 0.0 0.0 - 0.2 %   Neutrophils Relative % 78 %   Neutro Abs 8.7 (H) 1.7 - 7.7  K/uL   Lymphocytes Relative 16 %   Lymphs Abs 1.8 0.7 - 4.0 K/uL   Monocytes Relative 4 %   Monocytes Absolute 0.5 0.1 - 1.0 K/uL   Eosinophils Relative 1 %   Eosinophils Absolute 0.1 0.0 - 0.5 K/uL   Basophils Relative 0 %   Basophils Absolute 0.0 0.0 - 0.1 K/uL   Immature Granulocytes 1 %   Abs Immature Granulocytes 0.06 0.00 - 0.07 K/uL    Comment: Performed at [facility], [place] [address], [place], [place] [address]  Lipase     Status: None   Collection Time: 09/06/19  3:19 PM  Result Value Ref Range   Lipase 24 11 - 51 U/L    Comment:  Performed at [facility], [address] [place], [place] [address]  [place]     Status: Abnormal   Collection Time: 09/06/19  3:19 PM  Result Value Ref Range   Total CK 28 (L) 49 - 397 U/L    Comment: Performed at [facility], [address] [place], [place] [address]  Lactic acid, plasma     Status: None   Collection Time: 09/06/19  3:20 PM  Result Value Ref Range   Lactic Acid, Venous 1.7 0.5 - 1.9 mmol/L    Comment: Performed at [facility], [address] [place], [place] [address]  Urinalysis, Routine w reflex microscopic     Status: Abnormal   Collection Time: 09/06/19  4:53 PM  Result Value Ref Range   Color, Urine YELLOW YELLOW   APPearance CLEAR CLEAR   Specific Gravity, Urine 1.021 1.005 - 1.030   pH 7.0 5.0 - 8.0   Glucose, UA >=500 (A) NEGATIVE mg/dL   Hgb urine dipstick SMALL (A) NEGATIVE   Bilirubin Urine NEGATIVE NEGATIVE   Ketones, ur 80 (A) NEGATIVE mg/dL   Protein, ur NEGATIVE NEGATIVE mg/dL   Nitrite NEGATIVE NEGATIVE   Leukocytes,Ua NEGATIVE NEGATIVE   RBC / HPF 0-5 0 - 5 RBC/hpf   WBC, UA 0-5 0 - 5 WBC/hpf   Bacteria, UA NONE SEEN NONE SEEN   Mucus PRESENT     Comment: Performed at [facility], [address] [place], [place] [address]  Lactic acid, plasma     Status: None   Collection Time: 09/06/19  5:44 PM   Result Value Ref Range   Lactic Acid, Venous 0.9 0.5 - 1.9 mmol/L    Comment: Performed at [facility], [address] [place], [place] [address]    Blood Alcohol level:  Lab Results  Component Value Date   ETH <10 09/05/2019   ETH <10 04/10/2019    Metabolic Disorder Labs:  No results found for: HGBA1C, MPG No results found for: PROLACTIN No results found for: CHOL, TRIG, HDL, CHOLHDL, VLDL, LDLCALC  Current Medications: No current facility-administered medications for this encounter.   PTA Medications: Medications Prior to Admission  Medication Sig Dispense Refill Last Dose  . lisinopril (ZESTRIL) 10 MG tablet Take 20 mg by mouth daily.     . ondansetron (ZOFRAN) 4 MG tablet Take 1 tablet (4 mg total) by mouth every 8 (eight) hours as needed for up to 12 doses for nausea or vomiting. 12 tablet 0   . QUEtiapine (SEROQUEL) 100 MG tablet Take 100 mg by mouth at bedtime.       Musculoskeletal: Strength & Muscle Tone: within normal limits Gait & Station: normal Patient leans: NA  Psychiatric Specialty Exam: Physical Exam  Review of Systems reports worsening abdominal pain x 2 weeks, nausea , vomiting. No shortness of breath . No cough  Blood pressure (!) 137/103, pulse (!) 106, temperature 98.2 F (36.8 C), temperature source Oral, resp. rate 18.There is no height or weight on file to calculate BMI.  General Appearance: Fairly Groomed  Eye Contact:  Good  Speech:  Normal Rate  Volume:  Normal  Mood:  Depressed and Dysphoric  Affect:  Congruent  Thought Process:  Linear and Descriptions of Associations: Intact  Orientation:  Other:  fully alert and attentive ]  Thought Content:  denies hallucinations at this time and does not appear internally preoccupied , no delusions are expressed   Suicidal Thoughts:  No currently denies suicidal plan or intention and contracts for safety on unit , denies homicidal or violent ideations  Homicidal Thoughts:  No   Memory:  recent and remote fair   Judgement:  Fair  Insight:  Fair  Psychomotor Activity:  mild restlessness, but improved compared to yesterday's presentation. No significant distal tremors or diaphoresis noted at this time  Concentration:  Concentration: Fair and Attention Span: Fair  Recall:  [employer] of Knowledge:  Fair  Language:  Good  Akathisia:  Negative  Handed:  Right  AIMS (if indicated):     Assets:  Desire for Improvement Resilience  ADL's:  Intact  Cognition:  WNL  Sleep:       Treatment Plan Summary: Daily contact with patient to assess and evaluate symptoms and progress in treatment, Medication management, Plan inpatient treatment  and medications as below  Observation Level/Precautions:  15 minute checks  Laboratory:  BMP in AM, Lipid Panel, TSH EKG  12/10 QTc 491. Will recheck to monitor QTc interval   Psychotherapy:  milieu, group therapy  Medications:  Based on above history and presentation ( nausea, vomiting, abdominal pain/cramping, restlessness) and history of opiate analgesic use will initiate Opiate WDL protocol. Denies BZD abuse and admission BAL negative/UDS negative for BZDs, but as presenting with elevated vital signs, will initiate Ativan PRN for WDL as needed as per CIWA protocol. Patient reports that " reflux " OTC medications were helping alleviate symptoms and states he thinks he is suffering for GERD  Will start Protonix and Carafate .   Consultations: as needed    Discharge Concerns:  -  Estimated LOS: 4 days   Other:     Physician Treatment Plan for Primary Diagnosis: Depression Long Term Goal(s): Improvement in symptoms so as ready for discharge  Short Term Goals: Ability to identify changes in lifestyle to reduce recurrence of condition will improve, Ability to verbalize feelings will improve, Ability to disclose and discuss suicidal ideas, Ability to demonstrate self-control will improve, Ability to identify and develop effective  coping behaviors will improve and Ability to maintain clinical measurements within normal limits will improve  Physician Treatment Plan for Secondary Diagnosis: Suicidal Ideations Long Term Goal(s): Improvement in symptoms so as ready for discharge  Short Term Goals: Ability to identify changes in lifestyle to reduce recurrence of condition will improve, Ability to verbalize feelings will improve, Ability to disclose and discuss suicidal ideas, Ability to demonstrate self-control will improve, Ability to identify and develop effective coping behaviors will improve and Ability to maintain clinical measurements within normal limits will improve  I certify that inpatient services furnished can reasonably be expected to improve the patient's condition.    Craige Cotta, MD 12/11/202010:03 AM

## 2019-09-08 ENCOUNTER — Other Ambulatory Visit: Payer: Self-pay

## 2019-09-08 ENCOUNTER — Encounter (HOSPITAL_COMMUNITY): Payer: Self-pay | Admitting: Psychiatry

## 2019-09-08 ENCOUNTER — Inpatient Hospital Stay (HOSPITAL_COMMUNITY)
Admission: AD | Admit: 2019-09-08 | Discharge: 2019-09-12 | Disposition: A | Discharge status: Other Acute Inpatient Hospital | Payer: Medicaid Other | Source: Intra-hospital | Attending: Internal Medicine | Admitting: Internal Medicine

## 2019-09-08 ENCOUNTER — Emergency Department (HOSPITAL_COMMUNITY)
Admission: EM | Admit: 2019-09-08 | Discharge: 2019-09-08 | Discharge status: Absent without leave | Payer: Medicaid Other

## 2019-09-08 ENCOUNTER — Inpatient Hospital Stay (HOSPITAL_COMMUNITY): Payer: Medicaid Other

## 2019-09-08 DIAGNOSIS — F332 Major depressive disorder, recurrent severe without psychotic features: Secondary | ICD-10-CM | POA: Diagnosis present

## 2019-09-08 DIAGNOSIS — N179 Acute kidney failure, unspecified: Secondary | ICD-10-CM | POA: Diagnosis present

## 2019-09-08 DIAGNOSIS — R45851 Suicidal ideations: Secondary | ICD-10-CM

## 2019-09-08 DIAGNOSIS — F1510 Other stimulant abuse, uncomplicated: Secondary | ICD-10-CM

## 2019-09-08 DIAGNOSIS — R1032 Left lower quadrant pain: Secondary | ICD-10-CM

## 2019-09-08 LAB — CBC WITH DIFFERENTIAL/PLATELET
Abs Immature Granulocytes: 0.03 10*3/uL (ref 0.00–0.07)
Basophils Absolute: 0 10*3/uL (ref 0.0–0.1)
Basophils Relative: 1 %
Eosinophils Absolute: 0.2 10*3/uL (ref 0.0–0.5)
Eosinophils Relative: 3 %
HCT: 42.3 % (ref 39.0–52.0)
Hemoglobin: 14 g/dL (ref 13.0–17.0)
Immature Granulocytes: 0 %
Lymphocytes Relative: 24 %
Lymphs Abs: 1.8 10*3/uL (ref 0.7–4.0)
MCH: 30.5 pg (ref 26.0–34.0)
MCHC: 33.1 g/dL (ref 30.0–36.0)
MCV: 92.2 fL (ref 80.0–100.0)
Monocytes Absolute: 0.5 10*3/uL (ref 0.1–1.0)
Monocytes Relative: 7 %
Neutro Abs: 4.8 10*3/uL (ref 1.7–7.7)
Neutrophils Relative %: 65 %
Platelets: 390 10*3/uL (ref 150–400)
RBC: 4.59 MIL/uL (ref 4.22–5.81)
RDW: 13.1 % (ref 11.5–15.5)
WBC: 7.4 10*3/uL (ref 4.0–10.5)
nRBC: 0 % (ref 0.0–0.2)

## 2019-09-08 LAB — URINALYSIS, ROUTINE W REFLEX MICROSCOPIC
Bacteria, UA: NONE SEEN
Bilirubin Urine: NEGATIVE
Glucose, UA: 150 mg/dL — AB
Hgb urine dipstick: NEGATIVE
Ketones, ur: NEGATIVE mg/dL
Leukocytes,Ua: NEGATIVE
Nitrite: NEGATIVE
Protein, ur: 30 mg/dL — AB
Specific Gravity, Urine: 1.027 (ref 1.005–1.030)
pH: 5 (ref 5.0–8.0)

## 2019-09-08 LAB — LACTIC ACID, PLASMA
Lactic Acid, Venous: 1 mmol/L (ref 0.5–1.9)
Lactic Acid, Venous: 1.3 mmol/L (ref 0.5–1.9)

## 2019-09-08 LAB — BASIC METABOLIC PANEL
Anion gap: 11 (ref 5–15)
BUN: 31 mg/dL — ABNORMAL HIGH (ref 6–20)
CO2: 26 mmol/L (ref 22–32)
Calcium: 9.5 mg/dL (ref 8.9–10.3)
Chloride: 100 mmol/L (ref 98–111)
Creatinine, Ser: 2.78 mg/dL — ABNORMAL HIGH (ref 0.61–1.24)
GFR calc Af Amer: 28 mL/min — ABNORMAL LOW (ref >60)
GFR calc non Af Amer: 25 mL/min — ABNORMAL LOW (ref >60)
Glucose, Bld: 97 mg/dL (ref 70–99)
Potassium: 4.6 mmol/L (ref 3.5–5.1)
Sodium: 137 mmol/L (ref 135–145)

## 2019-09-08 LAB — CK: Total CK: 63 U/L (ref 49–397)

## 2019-09-08 LAB — LIPID PANEL
Cholesterol: 203 mg/dL — ABNORMAL HIGH (ref 0–200)
HDL: 28 mg/dL — ABNORMAL LOW (ref >40)
LDL Cholesterol: 144 mg/dL — ABNORMAL HIGH (ref 0–99)
Total CHOL/HDL Ratio: 7.3 RATIO
Triglycerides: 153 mg/dL — ABNORMAL HIGH (ref <150)
VLDL: 31 mg/dL (ref 0–40)

## 2019-09-08 LAB — HEMOGLOBIN A1C
Hgb A1c MFr Bld: 5.3 % (ref 4.8–5.6)
Mean Plasma Glucose: 105.41 mg/dL

## 2019-09-08 LAB — TSH: TSH: 1.226 u[IU]/mL (ref 0.350–4.500)

## 2019-09-08 LAB — CREATININE, SERUM
Creatinine, Ser: 1.95 mg/dL — ABNORMAL HIGH (ref 0.61–1.24)
GFR calc Af Amer: 44 mL/min — ABNORMAL LOW (ref >60)
GFR calc non Af Amer: 38 mL/min — ABNORMAL LOW (ref >60)

## 2019-09-08 LAB — HIV ANTIBODY (ROUTINE TESTING W REFLEX): HIV Screen 4th Generation wRfx: NONREACTIVE

## 2019-09-08 MED ORDER — HEPARIN SODIUM (PORCINE) 5000 UNIT/ML IJ SOLN
5000.0000 [IU] | Freq: Three times a day (TID) | INTRAMUSCULAR | Status: DC
  Administered 2019-09-08 – 2019-09-09 (×3): 5000 [IU] via SUBCUTANEOUS
  Filled 2019-09-08 (×3): qty 1

## 2019-09-08 MED ORDER — ACETAMINOPHEN 325 MG PO TABS: 650.0000 mg | ORAL_TABLET | Freq: Four times a day (QID) | ORAL | Status: DC | PRN

## 2019-09-08 MED ORDER — ONDANSETRON HCL 4 MG PO TABS: 4.0000 mg | ORAL_TABLET | Freq: Three times a day (TID) | ORAL | Status: DC | PRN

## 2019-09-08 MED ORDER — HYDROMORPHONE HCL 1 MG/ML IJ SOLN
0.5000 mg | Freq: Once | INTRAMUSCULAR | Status: AC
  Administered 2019-09-08: 0.5 mg via INTRAVENOUS
  Filled 2019-09-08: qty 1

## 2019-09-08 MED ORDER — ACETAMINOPHEN 650 MG RE SUPP: 650.0000 mg | Freq: Four times a day (QID) | RECTAL | Status: DC | PRN

## 2019-09-08 MED ORDER — PROMETHAZINE HCL 25 MG/ML IJ SOLN
25.0000 mg | Freq: Once | INTRAMUSCULAR | Status: AC
  Administered 2019-09-08: 25 mg via INTRAVENOUS
  Filled 2019-09-08: qty 1

## 2019-09-08 MED ORDER — FENTANYL CITRATE (PF) 100 MCG/2ML IJ SOLN
100.0000 ug | Freq: Once | INTRAMUSCULAR | Status: AC
  Administered 2019-09-08: 100 ug via INTRAVENOUS
  Filled 2019-09-08: qty 2

## 2019-09-08 MED ORDER — SODIUM CHLORIDE 0.9 % IV SOLN
INTRAVENOUS | Status: DC
  Administered 2019-09-08 – 2019-09-09 (×2): via INTRAVENOUS

## 2019-09-08 MED ORDER — PANTOPRAZOLE SODIUM 40 MG PO TBEC
40.0000 mg | DELAYED_RELEASE_TABLET | Freq: Every day | ORAL | Status: DC
  Administered 2019-09-08 – 2019-09-12 (×5): 40 mg via ORAL
  Filled 2019-09-08 (×5): qty 1

## 2019-09-08 MED ORDER — SENNOSIDES-DOCUSATE SODIUM 8.6-50 MG PO TABS
1.0000 | ORAL_TABLET | Freq: Every evening | ORAL | Status: DC | PRN
  Administered 2019-09-10 – 2019-09-11 (×2): 1 via ORAL
  Filled 2019-09-08 (×2): qty 1

## 2019-09-08 MED ORDER — HYDROCODONE-ACETAMINOPHEN 5-325 MG PO TABS
1.0000 | ORAL_TABLET | ORAL | Status: DC | PRN
  Administered 2019-09-08: 2 via ORAL
  Filled 2019-09-08 (×2): qty 2

## 2019-09-08 MED ORDER — SODIUM CHLORIDE 0.9 % IV BOLUS
2000.0000 mL | Freq: Once | INTRAVENOUS | Status: AC
  Administered 2019-09-08: 2000 mL via INTRAVENOUS

## 2019-09-08 NOTE — ED Triage Notes (Signed)
He comes to [place] from [facility] where he is currently undergoing inpatient treatment for major depression. He was found on routine lab testing to have somewhat elevated BUN and Creatinine levels. The providers at [facility]. want him to be evaluated here for this. He mentions some persistent abd. Discomfort, for which he was seen here recently, and, among other things, CT abdomen was performed.

## 2019-09-08 NOTE — Progress Notes (Signed)
[place] NOVEL CORONAVIRUS (COVID-19) DAILY CHECK-OFF SYMPTOMS - answer yes or no to each - every day NO YES  Have you had a fever in the past 24 hours?  . Fever (Temp > 37.80C / 100F) X   Have you had any of these symptoms in the past 24 hours? . New Cough .  Sore Throat  .  Shortness of Breath .  Difficulty Breathing .  Unexplained Body Aches   X   Have you had any one of these symptoms in the past 24 hours not related to allergies?   . Runny Nose .  Nasal Congestion .  Sneezing   X   If you have had runny nose, nasal congestion, sneezing in the past 24 hours, has it worsened?  X   EXPOSURES - check yes or no X   Have you traveled outside the state in the past 14 days?  X   Have you been in contact with someone with a confirmed diagnosis of COVID-19 or PUI in the past 14 days without wearing appropriate PPE?  X   Have you been living in the same home as a person with confirmed diagnosis of COVID-19 or a PUI (household contact)?    X   Have you been diagnosed with COVID-19?    X              What to do next: Answered NO to all: Answered YES to anything:   Proceed with unit schedule Follow the [facility] Inpatient Flowsheet.   

## 2019-09-08 NOTE — Progress Notes (Signed)
Patient has had minimal interaction with peers Isolative to his room after hs medications received. Safety maintained with 15 min checks.

## 2019-09-08 NOTE — Progress Notes (Signed)
ED TO INPATIENT HANDOFF REPORT  Name/Age/Gender Tim Morales 53 y.o. male  Code Status Code Status History    Date Active Date Inactive Code Status Order ID Comments User Context   09/07/2019 2109 09/08/2019 1135 Full Code [id number]  Rozetta Nunnery, NP Inpatient   09/07/2019 0333 09/07/2019 0604 Full Code [id number]  Mliss Fritz, NP Inpatient   09/06/2019 0656 09/07/2019 0324 Full Code [id number]  Mliss Fritz, NP Inpatient   09/05/2019 2022 09/06/2019 0415 Full Code [id number]  Milton Ferguson, MD ED   Advance Care Planning Activity      Home/SNF/Other Possible [facility]  Chief Complaint Severe recurrent major depression without psychotic features ([place]) [F33.2] AKI (acute kidney injury) ([place]) [N17.9]  Level of Care/Admitting Diagnosis ED Disposition    ED Disposition Condition [place]: [facility] [[id number]]  Level of Care: Med-Surg [16]  Covid Evaluation: Asymptomatic Screening Protocol (No Symptoms)  Diagnosis: AKI (acute kidney injury) [facility]) [[id number]]  Admitting Physician: Truddie Hidden [[id number]]  Attending Physician: Truddie Hidden [[id number]]  Estimated length of stay: past midnight tomorrow  Certification:: I certify this patient will need inpatient services for at least 2 midnights       Medical History Past Medical History:  Diagnosis Date  . Back pain   . Bulging lumbar disc   . Hypertension     Allergies No Known Allergies  IV Location/Drains/Wounds Patient Lines/Drains/Airways Status   Active Line/Drains/Airways    Name:   Placement date:   Placement time:   Site:   Days:   Peripheral IV 09/08/19 Left Forearm   09/08/19    1306    Forearm   less than 1          Labs/Imaging Results for orders placed or performed during the hospital encounter of 09/07/19 (from the past 48 hour(s))  TSH     Status: None   Collection Time: 09/08/19  6:59 AM  Result Value Ref Range   TSH 1.226 0.350 - 4.500 uIU/mL   Comment: Performed by a 3rd Generation assay with a functional sensitivity of <=0.01 uIU/mL. Performed at [facility], [place] [address] [place], [place] [address]   Lipid panel     Status: Abnormal   Collection Time: 09/08/19  6:59 AM  Result Value Ref Range   Cholesterol 203 (H) 0 - 200 mg/dL   Triglycerides 153 (H) <150 mg/dL   HDL 28 (L) >40 mg/dL   Total CHOL/HDL Ratio 7.3 RATIO   VLDL 31 0 - 40 mg/dL   LDL Cholesterol 144 (H) 0 - 99 mg/dL    Comment:        Total Cholesterol/HDL:CHD Risk Coronary Heart Disease Risk Table                     Men   Women  1/2 Average Risk   3.4   3.3  Average Risk       5.0   4.4  2 X Average Risk   9.6   7.1  3 X Average Risk  23.4   11.0        Use the calculated Patient Ratio above and the CHD Risk Table to determine the patient's CHD Risk.        ATP III CLASSIFICATION (LDL):  <100     mg/dL   Optimal  100-129  mg/dL   Near or Above  Optimal  130-159  mg/dL   Borderline  622-633  mg/dL   High  >354     mg/dL   Very High Performed at [facility], [address] [place], [place] [address]   Hemoglobin A1c     Status: None   Collection Time: 09/08/19  6:59 AM  Result Value Ref Range   Hgb A1c MFr Bld 5.3 4.8 - 5.6 %    Comment: (NOTE) Pre diabetes:          5.7%-6.4% Diabetes:              >6.4% Glycemic control for   <7.0% adults with diabetes    Mean Plasma Glucose 105.41 mg/dL    Comment: Performed at [facility], [address], [place], [place] [address]  Basic metabolic panel     Status: Abnormal   Collection Time: 09/08/19  6:59 AM  Result Value Ref Range   Sodium 137 135 - 145 mmol/L   Potassium 4.6 3.5 - 5.1 mmol/L   Chloride 100 98 - 111 mmol/L   CO2 26 22 - 32 mmol/L   Glucose, Bld 97 70 - 99 mg/dL   BUN 31 (H) 6 - 20 mg/dL   Creatinine, Ser 3.42 (H) 0.61 - 1.24 mg/dL   Calcium 9.5 8.9 - 87.6 mg/dL   GFR calc non Af Amer 25 (L) >60 mL/min   GFR  calc Af Amer 28 (L) >60 mL/min   Anion gap 11 5 - 15    Comment: Performed at [facility], [address], [place], [place] [address]  CK     Status: None   Collection Time: 09/08/19  1:21 PM  Result Value Ref Range   Total CK 63 49 - 397 U/L    Comment: Performed at [facility], [address] [place], [place] [address]  Lactic acid, plasma     Status: None   Collection Time: 09/08/19  1:21 PM  Result Value Ref Range   Lactic Acid, Venous 1.0 0.5 - 1.9 mmol/L    Comment: Performed at [facility], [address] [place], [place] [address]  CBC with Differential     Status: None   Collection Time: 09/08/19  1:21 PM  Result Value Ref Range   WBC 7.4 4.0 - 10.5 K/uL   RBC 4.59 4.22 - 5.81 MIL/uL   Hemoglobin 14.0 13.0 - 17.0 g/dL   HCT 16.3 84.5 - 36.4 %   MCV 92.2 80.0 - 100.0 fL   MCH 30.5 26.0 - 34.0 pg   MCHC 33.1 30.0 - 36.0 g/dL   RDW 68.0 32.1 - 22.4 %   Platelets 390 150 - 400 K/uL   nRBC 0.0 0.0 - 0.2 %   Neutrophils Relative % 65 %   Neutro Abs 4.8 1.7 - 7.7 K/uL   Lymphocytes Relative 24 %   Lymphs Abs 1.8 0.7 - 4.0 K/uL   Monocytes Relative 7 %   Monocytes Absolute 0.5 0.1 - 1.0 K/uL   Eosinophils Relative 3 %   Eosinophils Absolute 0.2 0.0 - 0.5 K/uL   Basophils Relative 1 %   Basophils Absolute 0.0 0.0 - 0.1 K/uL   Immature Granulocytes 0 %   Abs Immature Granulocytes 0.03 0.00 - 0.07 K/uL    Comment: Performed at [facility], [address], [place], [place] [address]  Urinalysis, Routine w reflex microscopic     Status: Abnormal  Collection Time: 09/08/19  1:21 PM  Result Value Ref Range   Color, Urine AMBER (A) YELLOW    Comment: BIOCHEMICALS MAY BE AFFECTED BY COLOR   APPearance CLOUDY (A) CLEAR   Specific Gravity, Urine 1.027 1.005 - 1.030   pH 5.0 5.0 - 8.0   Glucose, UA 150 (A) NEGATIVE mg/dL   Hgb urine dipstick NEGATIVE NEGATIVE   Bilirubin Urine NEGATIVE NEGATIVE    Ketones, ur NEGATIVE NEGATIVE mg/dL   Protein, ur 30 (A) NEGATIVE mg/dL   Nitrite NEGATIVE NEGATIVE   Leukocytes,Ua NEGATIVE NEGATIVE   RBC / HPF 0-5 0 - 5 RBC/hpf   WBC, UA 0-5 0 - 5 WBC/hpf   Bacteria, UA NONE SEEN NONE SEEN   Squamous Epithelial / LPF 0-5 0 - 5   Mucus PRESENT    Hyaline Casts, UA PRESENT     Comment: Performed at [facility], [address] [place], [place] [address]   DG Chest Portable 1 View  Result Date: 09/08/2019 CLINICAL DATA:  Short of breath.  Weakness. EXAM: PORTABLE CHEST 1 VIEW COMPARISON:  08/04/2019 FINDINGS: Cardiac silhouette is normal in size. Normal mediastinal and hilar contours. Lungs are clear.  No pleural effusion or pneumothorax. Skeletal structures are grossly intact. IMPRESSION: No active disease. Electronically Signed   By: Amie Portland M.D.   On: 09/08/2019 12:37   CT Angio Abd/Pel W and/or Wo Contrast  Result Date: 09/06/2019 CLINICAL DATA:  Persistent left lower quadrant pain EXAM: CTA ABDOMEN AND PELVIS WITHOUT AND WITH CONTRAST TECHNIQUE: Multidetector CT imaging of the abdomen and pelvis was performed using the standard protocol during bolus administration of intravenous contrast. Multiplanar reconstructed images and MIPs were obtained and reviewed to evaluate the vascular anatomy. CONTRAST:  OMNIPAQUE IOHEXOL 350 MG/ML SOLN COMPARISON:  CT from the previous day. FINDINGS: VASCULAR Aorta: Mild atherosclerotic changes are noted without aneurysmal dilatation or dissection. Celiac: Mild median arcuate ligament compression is noted although no focal stenotic changes are seen. SMA: No focal stenosis is noted. No areas of mesenteric ischemia are seen. Renals: Mild atherosclerotic changes are noted in single renal arteries bilaterally. No focal hemodynamically significant stenosis is noted. IMA: Patent without evidence of aneurysm, dissection, vasculitis or significant stenosis. Iliacs: Atherosclerotic changes are noted  without aneurysmal dilatation or focal stenosis. Veins: No venous abnormality is noted. Review of the MIP images confirms the above findings. NON-VASCULAR Lower chest: No acute abnormality. Hepatobiliary: Liver is again within normal limits. Vicarious excretion of contrast material is noted within the gallbladder. Pancreas: Unremarkable. No pancreatic ductal dilatation or surrounding inflammatory changes. Spleen: Normal in size without focal abnormality. Adrenals/Urinary Tract: Adrenal glands are within normal limits. Kidneys demonstrate a normal enhancement pattern bilaterally. No renal calculi or obstructive changes are seen. The ureters are within normal limits. The bladder is partially distended. Stomach/Bowel: Mild diverticular change of the colon is again identified without evidence of diverticulitis. No obstructive or inflammatory changes are seen. The appendix is within normal limits. No small bowel or gastric abnormality is seen with the exception of a small sliding-type hiatal hernia. Lymphatic: No specific lymphadenopathy is noted. Reproductive: Prostate is unremarkable. Other: No abdominal wall hernia or abnormality. No abdominopelvic ascites. Musculoskeletal: No acute or significant osseous findings. 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 IMPRESSION: VASCULAR No aortic abnormality or branch abnormality is noted. No findings to suggest visceral ischemia are seen. NON-VASCULAR Changes of diverticulosis without evidence of diverticulitis. The overall appearance of the abdomen and pelvis is stable  from the previous day. Electronically Signed   By: Alcide CleverMark  Lukens M.D.   On: 09/06/2019 18:31    Pending Labs Unresulted Labs (From admission, onward)    Start     Ordered   09/08/19 1732  SARS CORONAVIRUS 2 (TAT 6-24 HRS) Nasopharyngeal Nasopharyngeal Swab  (Tier 3 (TAT 6-24 hrs))  Once,   STAT    Question Answer Comment  Is this test for diagnosis or screening Screening   Symptomatic for COVID-19 as  defined by CDC No   Hospitalized for COVID-19 No   Admitted to ICU for COVID-19 No   Previously tested for COVID-19 Yes   Resident in a congregate (group) care setting Yes   Employed in healthcare setting No      09/08/19 1731   09/08/19 1200  Lactic acid, plasma  Now then every 2 hours,   STAT     09/08/19 1200   Signed and Held  HIV Antibody (routine testing w rflx)  (HIV Antibody (Routine testing w reflex) panel)  Once,   R     Signed and Held   Signed and Held  CBC  (heparin)  Once,   R    Comments: Baseline for heparin therapy IF NOT ALREADY DRAWN.  Notify MD if PLT < 100 K.    Signed and Held   Signed and Held  Creatinine, serum  (heparin)  Once,   R    Comments: Baseline for heparin therapy IF NOT ALREADY DRAWN.    Signed and Held   Signed and Held  Urinalysis, Routine w reflex microscopic  Once,   R     Signed and Held          Vitals/Pain Today's Vitals   09/08/19 1322 09/08/19 1400 09/08/19 1500 09/08/19 1700  BP:  99/70 113/80 101/70  Pulse:  81 87 92  Resp:  (!) 24    Temp:      TempSrc:      SpO2:  98% 100% 99%  PainSc: 7        Isolation Precautions No active isolations  Medications Medications  sucralfate (CARAFATE) tablet 1 g (1 g Oral Given 09/08/19 1258)  dicyclomine (BENTYL) tablet 20 mg (20 mg Oral Given 09/08/19 0848)  methocarbamol (ROBAXIN) tablet 500 mg (500 mg Oral Given 09/07/19 1204)  thiamine (VITAMIN B-1) tablet 100 mg (100 mg Oral Given 09/08/19 0848)  multivitamin with minerals tablet 1 tablet (1 tablet Oral Given 09/08/19 0851)  LORazepam (ATIVAN) tablet 1 mg (1 mg Oral Given 09/08/19 0853)  loperamide (IMODIUM) capsule 2-4 mg (has no administration in time range)  ondansetron (ZOFRAN-ODT) disintegrating tablet 4 mg (4 mg Oral Given 09/07/19 1204)  acetaminophen (TYLENOL) tablet 650 mg (has no administration in time range)  hydrOXYzine (ATARAX/VISTARIL) tablet 25 mg (has no administration in time range)  pantoprazole (PROTONIX) EC  tablet 40 mg (40 mg Oral Given 09/08/19 1456)  thiamine (B-1) injection 100 mg (100 mg Intramuscular Given 09/07/19 1158)  promethazine (PHENERGAN) injection 25 mg (25 mg Intravenous Given 09/08/19 1258)  sodium chloride 0.9 % bolus 2,000 mL (0 mLs Intravenous Stopped 09/08/19 1456)  fentaNYL (SUBLIMAZE) injection 100 mcg (100 mcg Intravenous Given 09/08/19 1308)  HYDROmorphone (DILAUDID) injection 0.5 mg (0.5 mg Intravenous Given 09/08/19 1456)    Mobility walks

## 2019-09-08 NOTE — H&P (Signed)
History and Physical    Tim Morales MLY:[id number] DOB: [birth year] DOA: 09/07/2019  PCP: Patient, No Pcp Per  Patient coming from: [facility]  I have personally briefly reviewed patient's old medical records in [facility]  Chief Complaint: Abdominal pain  HPI: Tim Morales is a 53 y.o. male with medical history significant of depression, HTN, Chronic back pain c/b opiate use who presents from behavioral health for AKI.  Patient has been complaining of ongoing LLQ abdominal pain for 3 days now and had been evaluated with CT scans twice, once routine and follow-up with CTA, neither of which provided any etiology of his ongoing pain.  He reports in the past 24 hours he has not had much diarrhea but he reports increased urination and some dysuria yesterday.  He denies any fevers, chills, cough, sob, chest pain.  He denies any use of NSAIDs recently.  Patient still reporting SI, will need to transfer back to [facility].  Denies tobacco use, states he uses alcohol twice a week (2 beers, no binges), he reports prior hx of cocaine and opiates - then reported he has multiple prescriptions for opiates which he has not abused - stating he has oxycodone, morphine  Review of Systems: As per HPI otherwise 10 point review of systems negative.    Past Medical History:  Diagnosis Date  . Back pain   . Bulging lumbar disc   . Hypertension     No past surgical history on file.   reports that he has never smoked. He has never used smokeless tobacco. He reports current alcohol use. He reports current drug use. Drug: Methamphetamines.  No Known Allergies  Fam hx reviewed, non-contributory  Prior to Admission medications   Medication Sig Start Date End Date Taking? Authorizing Provider  lisinopril (ZESTRIL) 10 MG tablet Take 20 mg by mouth daily. 04/23/19   [provider]  ondansetron (ZOFRAN) 4 MG tablet Take 1 tablet (4 mg total) by mouth every 8 (eight) hours as needed for up to 12 doses for  nausea or vomiting. 09/06/19   Terald Sleeper, MD  QUEtiapine (SEROQUEL) 100 MG tablet Take 100 mg by mouth at bedtime.    [provider]    Physical Exam: Vitals:   09/08/19 0242 09/08/19 0838 09/08/19 0839 09/08/19 1205  BP: 90/67 90/63 (!) 81/51 106/72  Pulse: 85 90 (!) 109 87  Resp: 16   (!) 25  Temp:  97.9 F (36.6 C)  98 F (36.7 C)  TempSrc:  Oral  Oral  SpO2: 97% 95%  98%     Vitals:   09/08/19 0242 09/08/19 0838 09/08/19 0839 09/08/19 1205  BP: 90/67 90/63 (!) 81/51 106/72  Pulse: 85 90 (!) 109 87  Resp: 16   (!) 25  Temp:  97.9 F (36.6 C)  98 F (36.7 C)  TempSrc:  Oral  Oral  SpO2: 97% 95%  98%   Constitutional: NAD, calm, comfortable, asking for dilaudid Eyes: PERRL, lids and conjunctivae normal ENMT: Mucous membranes are moist. Posterior pharynx clear of any exudate or lesions.Normal dentition.  Neck: normal, supple, no masses, no thyromegaly Respiratory: clear to auscultation bilaterally, no wheezing, no crackles. Normal respiratory effort. No accessory muscle use.  Cardiovascular: Regular rate and rhythm, no murmurs / rubs / gallops. No extremity edema. 2+ pedal pulses.  Abdomen: voluntarily guards, however distractible and allows palpation, LLQ + tenderness Musculoskeletal: no clubbing / cyanosis. No joint deformity upper and lower extremities. Good ROM, no contractures. Normal muscle  tone.  Skin: no rashes, lesions, ulcers. No induration Neurologic: CN 2-12 grossly intact. Sensation and strength grossly intact Psychiatric: Normal judgment and insight. Alert and oriented x 3. Normal mood.    Labs on Admission: I have personally reviewed following labs and imaging studies  CBC: Recent Labs  Lab 09/05/19 1323 09/06/19 1519 09/08/19 1321  WBC 14.7* 11.2* 7.4  NEUTROABS  --  8.7* 4.8  HGB 15.6 14.7 14.0  HCT 46.1 44.1 42.3  MCV 91.8 91.1 92.2  PLT 473* 420* 627   Basic Metabolic Panel: Recent Labs  Lab 09/05/19 1323 09/06/19 1519  09/08/19 0659  NA 137 139 137  K 4.1 3.8 4.6  CL 99 102 100  CO2 28 22 26   GLUCOSE 117* 104* 97  BUN 30* 24* 31*  CREATININE 1.94* 1.44* 2.78*  CALCIUM 9.4 9.1 9.5   GFR: Estimated Creatinine Clearance: 31 mL/min (A) (by C-G formula based on SCr of 2.78 mg/dL (H)). Liver Function Tests: Recent Labs  Lab 09/05/19 1323 09/06/19 1519  AST 12* 13*  ALT 12 13  ALKPHOS 53 57  BILITOT 0.9 1.1  PROT 7.9 8.1  ALBUMIN 4.3 4.4   Recent Labs  Lab 09/05/19 1323 09/06/19 1519  LIPASE 28 24   No results for input(s): AMMONIA in the last 168 hours. Coagulation Profile: No results for input(s): INR, PROTIME in the last 168 hours. Cardiac Enzymes: Recent Labs  Lab 09/06/19 1519 09/08/19 1321  CKTOTAL 28* 63   BNP (last 3 results) No results for input(s): PROBNP in the last 8760 hours. HbA1C: Recent Labs    09/08/19 0659  HGBA1C 5.3   CBG: No results for input(s): GLUCAP in the last 168 hours. Lipid Profile: Recent Labs    09/08/19 0659  CHOL 203*  HDL 28*  LDLCALC 144*  TRIG 153*  CHOLHDL 7.3   Thyroid Function Tests: Recent Labs    09/08/19 0659  TSH 1.226   Anemia Panel: No results for input(s): VITAMINB12, FOLATE, FERRITIN, TIBC, IRON, RETICCTPCT in the last 72 hours. Urine analysis:    Component Value Date/Time   COLORURINE AMBER (A) 09/08/2019 1321   APPEARANCEUR CLOUDY (A) 09/08/2019 1321   LABSPEC 1.027 09/08/2019 1321   PHURINE 5.0 09/08/2019 1321   GLUCOSEU 150 (A) 09/08/2019 1321   HGBUR NEGATIVE 09/08/2019 1321   BILIRUBINUR NEGATIVE 09/08/2019 1321   [place] 09/08/2019 1321   PROTEINUR 30 (A) 09/08/2019 1321   NITRITE NEGATIVE 09/08/2019 1321   LEUKOCYTESUR NEGATIVE 09/08/2019 1321    Radiological Exams on Admission: DG Chest Portable 1 View  Result Date: 09/08/2019 CLINICAL DATA:  Short of breath.  Weakness. EXAM: PORTABLE CHEST 1 VIEW COMPARISON:  08/04/2019 FINDINGS: Cardiac silhouette is normal in size. Normal  mediastinal and hilar contours. Lungs are clear.  No pleural effusion or pneumothorax. Skeletal structures are grossly intact. IMPRESSION: No active disease. Electronically Signed   By: Lajean Manes M.D.   On: 09/08/2019 12:37   CT Angio Abd/Pel W and/or Wo Contrast  Result Date: 09/06/2019 CLINICAL DATA:  Persistent left lower quadrant pain EXAM: CTA ABDOMEN AND PELVIS WITHOUT AND WITH CONTRAST TECHNIQUE: Multidetector CT imaging of the abdomen and pelvis was performed using the standard protocol during bolus administration of intravenous contrast. Multiplanar reconstructed images and MIPs were obtained and reviewed to evaluate the vascular anatomy. CONTRAST:  152mL OMNIPAQUE IOHEXOL 350 MG/ML SOLN COMPARISON:  CT from the previous day. FINDINGS: VASCULAR Aorta: Mild atherosclerotic changes are noted without aneurysmal dilatation or dissection. Celiac:  Mild median arcuate ligament compression is noted although no focal stenotic changes are seen. SMA: No focal stenosis is noted. No areas of mesenteric ischemia are seen. Renals: Mild atherosclerotic changes are noted in single renal arteries bilaterally. No focal hemodynamically significant stenosis is noted. IMA: Patent without evidence of aneurysm, dissection, vasculitis or significant stenosis. Iliacs: Atherosclerotic changes are noted without aneurysmal dilatation or focal stenosis. Veins: No venous abnormality is noted. Review of the MIP images confirms the above findings. NON-VASCULAR Lower chest: No acute abnormality. Hepatobiliary: Liver is again within normal limits. Vicarious excretion of contrast material is noted within the gallbladder. Pancreas: Unremarkable. No pancreatic ductal dilatation or surrounding inflammatory changes. Spleen: Normal in size without focal abnormality. Adrenals/Urinary Tract: Adrenal glands are within normal limits. Kidneys demonstrate a normal enhancement pattern bilaterally. No renal calculi or obstructive changes are  seen. The ureters are within normal limits. The bladder is partially distended. Stomach/Bowel: Mild diverticular change of the colon is again identified without evidence of diverticulitis. No obstructive or inflammatory changes are seen. The appendix is within normal limits. No small bowel or gastric abnormality is seen with the exception of a small sliding-type hiatal hernia. Lymphatic: No specific lymphadenopathy is noted. Reproductive: Prostate is unremarkable. Other: No abdominal wall hernia or abnormality. No abdominopelvic ascites. Musculoskeletal: No acute or significant osseous findings. 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 IMPRESSION: VASCULAR No aortic abnormality or branch abnormality is noted. No findings to suggest visceral ischemia are seen. NON-VASCULAR Changes of diverticulosis without evidence of diverticulitis. The overall appearance of the abdomen and pelvis is stable from the previous day. Electronically Signed   By: Alcide CleverMark  Lukens M.D.   On: 09/06/2019 18:31    Assessment/Plan  Tim MarkerBobby Morales is a 53 y.o. male with medical history significant of depression, HTN, Chronic back pain c/b opiate use who presents from behavioral health for AKI.  # AKI - likely multifactorial, pre-renal and intrarenal etiologies.  Dehydration and hypotensive episode in setting of likely CIN from having had two contrast studies in past 72 hours to evaluate abdominal pain.  Patient had also been continued on his lisinopril. - continue IVF and monitor - if failure to improve would obtain urine lytes to establish if ATN present  - hx not suggestive of obstruction - monitor I/O  # Possible Opiate withdrawal - patient with multiple opiate prescriptions in ambulatory setting, not very forthcoming with use - monitor, had been started on clonidine and atarax but will monitor off for now due to hypotensive episode  # HTN - held lisinopril - monitor  # Suicidal Ideation - continues to report, states  multiple things going on his life - maintain 1:1 sitter - touch base with psych once AKI resolves so [facility]  # Chronic back pain - reports he was taking gabapentin, opiates at home - will have PT/OT assess  - limit opiates - psych team had placed prn robaxin will contnue  DVT prophylaxis: SQH Code Status: Full Consults called: Psychiatry transferring to ER -> IP Admission status: inpatient   Clydia LlanoAravind Lisamarie Coke MD [facility] Pager [phone]  If 7PM-7AM, please contact night-coverage [email or web address] Password [email or web address]  09/08/2019, 2:28 PM

## 2019-09-08 NOTE — Progress Notes (Signed)
D. Pt awake in bed upon initial approach. Pt observed to be somewhat diaphoretic, and was complaining of stomach cramps and anxiety this am. Pt remains hypotensive (sitting 90/63, standing 81/51) Pt currently denies SI/HI and AVH  A. Labs and vitals monitored. Pt compliant with medications. Held clonidine due to low BP. Pt given personal pitcher of Gatorade and encouraged to drink fluids.  Pt supported emotionally and encouraged to express concerns and ask questions.   R. Pt remains safe with 15 minute checks. Will continue POC.

## 2019-09-08 NOTE — Progress Notes (Addendum)
[facility] MD Progress Note  09/08/2019 10:39 AM Tim Morales  MRN:  [id number] Subjective: Patient reports he is "feeling a little better today".  States he has not vomited thus far today and has been able to tolerate p.o. intake better.  States he had some breakfast and some Jell-O earlier.  He also states he has been able to drink fluids.  Abdominal pain has subsided partially.  Objective: I have reviewed chart notes and met with patient 53 year old male, admitted for depression and suicidal ideations with thoughts of overdosing.  He reported significant abdominal pain and was noted to have an elevated BUN and creatinine due to which he was transferred to ED for further work-up.  An angio abdominal CT was negative.  Repeat creatinine was improved.  Patient reported intermittent opiate analgesic use prior to admission and a review of [place] controlled substance registry show a long history of opiate analgesic prescriptions.  It was felt that symptoms could be related to opiate withdrawal and was started on opiate detox protocol.  Today patient reports feeling better.  As above, describes being able to accept p.o. intake better today without vomiting thus far- states he has been able to drink fluids this morning.  Reports some abdominal pain but appears calm, comfortable, in no acute distress and acknowledges severity of pain has subsided. Little milieu /group participation thus far.  He has been up from bed for brief periods of time. Labs reviewed-creatinine increased to 2.78, BUN 31, potassium 4.6 hemoglobin A1c 5.3, TSH 1.226. Vitals today 90/63, pulse 90 sitting, down to 81/51 , pulse 109 standing  Principal Problem: Depression Diagnosis: Active Problems:   Severe recurrent major depression without psychotic features ([place])  Total Time spent with patient: 20 minutes  Past Psychiatric History:   Past Medical History:  Past Medical History:  Diagnosis Date  . Back pain   . Bulging lumbar disc   .  Hypertension    No past surgical history on file. Family History: No family history on file. Family Psychiatric  History: Social History:  Social History   Substance and Sexual Activity  Alcohol Use Yes   Comment: Social     Social History   Substance and Sexual Activity  Drug Use Yes  . Types: Methamphetamines    Social History   Socioeconomic History  . Marital status: Single    Spouse name: Not on file  . Number of children: Not on file  . Years of education: Not on file  . Highest education level: Not on file  Occupational History  . Not on file  Tobacco Use  . Smoking status: Never Smoker  . Smokeless tobacco: Never Used  Substance and Sexual Activity  . Alcohol use: Yes    Comment: Social  . Drug use: Yes    Types: Methamphetamines  . Sexual activity: Not on file  Other Topics Concern  . Not on file  Social History Narrative  . Not on file   Social Determinants of Health   Financial Resource Strain:   . Difficulty of Paying Living Expenses: Not on file  Food Insecurity:   . Worried About Charity fundraiser in the Last Year: Not on file  . Ran Out of Food in the Last Year: Not on file  Transportation Needs:   . Lack of Transportation (Medical): Not on file  . Lack of Transportation (Non-Medical): Not on file  Physical Activity:   . Days of Exercise per Week: Not on file  . Minutes  of Exercise per Session: Not on file  Stress:   . Feeling of Stress : Not on file  Social Connections:   . Frequency of Communication with Friends and Family: Not on file  . Frequency of Social Gatherings with Friends and Family: Not on file  . Attends Religious Services: Not on file  . Active Member of Clubs or Organizations: Not on file  . Attends Archivist Meetings: Not on file  . Marital Status: Not on file   Additional Social History:   Sleep: improving   Appetite:  fair- improving   Current Medications: Current Facility-Administered Medications   Medication Dose Route Frequency Provider Last Rate Last Admin  . acetaminophen (TYLENOL) tablet 650 mg  650 mg Oral Q6H PRN Lindon Romp A, NP      . cloNIDine (CATAPRES) tablet 0.1 mg  0.1 mg Oral QID Daouda Lonzo, Myer Peer, MD   0.1 mg at 09/07/19 2125   Followed by  . [START ON 09/09/2019] cloNIDine (CATAPRES) tablet 0.1 mg  0.1 mg Oral BH-qamhs Natoshia Souter, Myer Peer, MD       Followed by  . [START ON 09/12/2019] cloNIDine (CATAPRES) tablet 0.1 mg  0.1 mg Oral QAC breakfast Jamin Panther A, MD      . dicyclomine (BENTYL) tablet 20 mg  20 mg Oral Q6H PRN Theda Payer, Myer Peer, MD   20 mg at 09/08/19 0848  . hydrOXYzine (ATARAX/VISTARIL) tablet 25 mg  25 mg Oral TID PRN Lindon Romp A, NP      . lisinopril (ZESTRIL) tablet 20 mg  20 mg Oral QHS Deionte Spivack, Myer Peer, MD   20 mg at 09/07/19 2126  . loperamide (IMODIUM) capsule 2-4 mg  2-4 mg Oral PRN Taniya Dasher, Myer Peer, MD      . LORazepam (ATIVAN) tablet 1 mg  1 mg Oral Q6H PRN Basel Defalco, Myer Peer, MD   1 mg at 09/08/19 0853  . methocarbamol (ROBAXIN) tablet 500 mg  500 mg Oral Q8H PRN Arilla Hice, Myer Peer, MD   500 mg at 09/07/19 1204  . multivitamin with minerals tablet 1 tablet  1 tablet Oral Daily Donivan Thammavong, Myer Peer, MD   1 tablet at 09/08/19 0851  . ondansetron (ZOFRAN-ODT) disintegrating tablet 4 mg  4 mg Oral Q6H PRN Naser Schuld, Myer Peer, MD   4 mg at 09/07/19 1204  . pantoprazole (PROTONIX) EC tablet 40 mg  40 mg Oral Daily Tashica Provencio, Myer Peer, MD   40 mg at 09/08/19 0848  . sucralfate (CARAFATE) tablet 1 g  1 g Oral TID WC & HS Deaisha Welborn, Myer Peer, MD   1 g at 09/08/19 0848  . thiamine (VITAMIN B-1) tablet 100 mg  100 mg Oral Daily Bary Limbach, Myer Peer, MD   100 mg at 09/08/19 0848    Lab Results:  Results for orders placed or performed during the hospital encounter of 09/07/19 (from the past 48 hour(s))  TSH     Status: None   Collection Time: 09/08/19  6:59 AM  Result Value Ref Range   TSH 1.226 0.350 - 4.500 uIU/mL    Comment: Performed by a 3rd Generation  assay with a functional sensitivity of <=0.01 uIU/mL. Performed at [facility], [place] [address], [place], [place] [address]   Lipid panel     Status: Abnormal   Collection Time: 09/08/19  6:59 AM  Result Value Ref Range   Cholesterol 203 (H) 0 - 200 mg/dL   Triglycerides 153 (H) <150 mg/dL   HDL  28 (L) >40 mg/dL   Total CHOL/HDL Ratio 7.3 RATIO   VLDL 31 0 - 40 mg/dL   LDL Cholesterol 144 (H) 0 - 99 mg/dL    Comment:        Total Cholesterol/HDL:CHD Risk Coronary Heart Disease Risk Table                     Men   Women  1/2 Average Risk   3.4   3.3  Average Risk       5.0   4.4  2 X Average Risk   9.6   7.1  3 X Average Risk  23.4   11.0        Use the calculated Patient Ratio above and the CHD Risk Table to determine the patient's CHD Risk.        ATP III CLASSIFICATION (LDL):  <100     mg/dL   Optimal  100-129  mg/dL   Near or Above                    Optimal  130-159  mg/dL   Borderline  160-189  mg/dL   High  >190     mg/dL   Very High Performed at [facility] [address], [place], [place] [address]   Hemoglobin A1c     Status: None   Collection Time: 09/08/19  6:59 AM  Result Value Ref Range   Hgb A1c MFr Bld 5.3 4.8 - 5.6 %    Comment: (NOTE) Pre diabetes:          5.7%-6.4% Diabetes:              >6.4% Glycemic control for   <7.0% adults with diabetes    Mean Plasma Glucose 105.41 mg/dL    Comment: Performed at [facility] [address], [place], [place] [address]  Basic metabolic panel     Status: Abnormal   Collection Time: 09/08/19  6:59 AM  Result Value Ref Range   Sodium 137 135 - 145 mmol/L   Potassium 4.6 3.5 - 5.1 mmol/L   Chloride 100 98 - 111 mmol/L   CO2 26 22 - 32 mmol/L   Glucose, Bld 97 70 - 99 mg/dL   BUN 31 (H) 6 - 20 mg/dL   Creatinine, Ser 2.78 (H) 0.61 - 1.24 mg/dL   Calcium 9.5 8.9 - 10.3 mg/dL   GFR calc non Af Amer 25 (L) >60 mL/min   GFR calc Af Amer 28 (L) >60 mL/min   Anion  gap 11 5 - 15    Comment: Performed at [facility] [address] [place], [place] [address]    Blood Alcohol level:  Lab Results  Component Value Date   [facility] <10 09/05/2019   ETH <10 01/75/1025    Metabolic Disorder Labs: Lab Results  Component Value Date   HGBA1C 5.3 09/08/2019   MPG 105.41 09/08/2019   No results found for: PROLACTIN Lab Results  Component Value Date   CHOL 203 (H) 09/08/2019   TRIG 153 (H) 09/08/2019   HDL 28 (L) 09/08/2019   CHOLHDL 7.3 09/08/2019   VLDL 31 09/08/2019   LDLCALC 144 (H) 09/08/2019    Physical Findings: AIMS:  , ,  ,  ,    CIWA:  CIWA-Ar Total: 3 COWS:  COWS Total Score: 7  Musculoskeletal: Strength & Muscle Tone: within normal limits Gait & Station: normal Patient leans:  N/A  Psychiatric Specialty Exam: Physical Exam  Review of Systems reports feeling better overall , with less abdominal pain. No vomiting today.   Blood pressure (!) 81/51, pulse (!) 109, temperature 97.9 F (36.6 C), temperature source Oral, resp. rate 16, SpO2 95 %.There is no height or weight on file to calculate BMI.  General Appearance: Fairly Groomed  Eye Contact:  Fair  Speech:  Normal Rate  Volume:  Normal  Mood:  reports feeling " a little better"  Affect:  vaguely constricted, smiles briefly at times  Thought Process:  Linear and Descriptions of Associations: Intact  Orientation:  Other:  alert , attentive  Thought Content:  denies hallucinations, no delusions  Suicidal Thoughts:  No currently denies suicidal or self injurious ideations, denies homicidal or violent ideations  Homicidal Thoughts:  No  Memory:  recent and remote fair  Judgement:  Fair/ improving  Insight:  Fair  Psychomotor Activity:  Currently does not appear to be in any acute distress, more comfortable  Concentration:  Concentration: Fair and Attention Span: Fair  Recall:  Good  Fund of Knowledge:  Good  Language:  Good  Akathisia:  Negative  Handed:   Left  AIMS (if indicated):     Assets:  Communication Skills Desire for Improvement Resilience  ADL's:  Intact  Cognition:  WNL  Sleep:  Number of Hours: 6.5   Assessment: 53 year old male who initially presented to ED reporting abdominal pain but also reported depression and suicidal ideations.  On unit presented uncomfortable with significant abdominal pain , vomiting, poor p.o. intake.  Creatinine was elevated.  Was transferred to ED for further work-up and management.  Was medically cleared and returned to [facility] yesterday.  Symptoms of abdominal pain are improving, vomiting also improving and states he is now better able to tolerate p.o. intake.  Based on history of opiate analgesic use prior to admission it is felt that opiate withdrawal may have been playing a contributing role to above symptoms. From a psychiatric perspective he is reporting improving mood and currently denies suicidal ideations. Of concern, in spite of improved p.o. intake/resolution of vomiting, creatinine is now up to 2.78.   Treatment Plan Summary: Daily contact with patient to assess and evaluate symptoms and progress in treatment, Medication management, Plan inpatient treatment  and medications as below I have reviewed presentation and creatinine elevation with ED physician.  At this time patient requires ED assessment, management, IV rehydration, possible inpatient medical unit admission.  Will transport to ED.   Will discontinue antihypertensive medication and Clonidine for now.   Jenne Campus, MD 09/08/2019, 10:39 AM

## 2019-09-08 NOTE — ED Provider Notes (Signed)
[facility] DEPT Provider Note   CSN: [id number] Arrival date & time: 09/08/19  1137     History Chief Complaint  Patient presents with  . Abnormal Lab    Tim Morales is a 53 y.o. male.  The history is provided by the patient and medical records. No language interpreter was used.  Abnormal Lab     53 year old male with history of depression, recently admitted to psychiatry for suicidal ideation on 12/09 sent to the ER for abnormal kidney function.  Patient report he has had persistent left lower quadrant abdominal pain for the past 2 to 3 days.  Pain is described as an achy throbbing sensation nonradiating with associated nausea, dry heaving, and having some loose stools.  Does not complain of any fever or chills no chest pain shortness of breath or productive cough no loss of taste or smell.  He has been evaluate for his condition for the past 2 days which include 2 consecutive abdominal pelvis CT scan with contrast without any acute finding aside from evidence of diverticulosis.  He currently managed by behavioral health due to suicidal ideation.  At this time he denies having any active suicidal ideation.  Recently tested negative for COVID-19.  He denies any recent recreational drug use.  He endorsed an 8 out of 10 persistent abdominal pain.  Past Medical History:  Diagnosis Date  . Back pain   . Bulging lumbar disc   . Hypertension     Patient Active Problem List   Diagnosis Date Noted  . Severe recurrent major depression without psychotic features ([place]) 09/07/2019  . MDD (major depressive disorder), recurrent episode, severe ([place]) 09/06/2019    No past surgical history on file.     No family history on file.  Social History   Tobacco Use  . Smoking status: Never Smoker  . Smokeless tobacco: Never Used  Substance Use Topics  . Alcohol use: Yes    Comment: Social  . Drug use: Yes    Types: Methamphetamines    Home  Medications Prior to Admission medications   Medication Sig Start Date End Date Taking? Authorizing Provider  lisinopril (ZESTRIL) 10 MG tablet Take 20 mg by mouth daily. 04/23/19   [provider]  ondansetron (ZOFRAN) 4 MG tablet Take 1 tablet (4 mg total) by mouth every 8 (eight) hours as needed for up to 12 doses for nausea or vomiting. 09/06/19   Wyvonnia Dusky, MD  QUEtiapine (SEROQUEL) 100 MG tablet Take 100 mg by mouth at bedtime.    [provider]    Allergies    Patient has no known allergies.  Review of Systems   Review of Systems  All other systems reviewed and are negative.   Physical Exam Updated Vital Signs BP (!) 81/51 (BP Location: Right Arm)   Pulse (!) 109   Temp 97.9 F (36.6 C) (Oral)   Resp 16   SpO2 95%   Physical Exam Vitals and nursing note reviewed.  Constitutional:      General: He is not in acute distress.    Appearance: He is well-developed.  HENT:     Head: Atraumatic.  Eyes:     Conjunctiva/sclera: Conjunctivae normal.  Cardiovascular:     Rate and Rhythm: Tachycardia present.     Pulses: Normal pulses.     Heart sounds: Normal heart sounds.  Pulmonary:     Effort: Pulmonary effort is normal.     Breath sounds: Normal breath sounds.  Abdominal:     Palpations: Abdomen is soft.     Tenderness: There is abdominal tenderness (Tenderness to left lower quadrant on palpation without guarding or rebound tenderness.).  Musculoskeletal:     Cervical back: Neck supple.  Skin:    Findings: No rash.  Neurological:     Mental Status: He is alert. Mental status is at baseline.  Psychiatric:        Mood and Affect: Mood normal.     ED Results / Procedures / Treatments   Labs (all labs ordered are listed, but only abnormal results are displayed) Labs Reviewed  LIPID PANEL - Abnormal; Notable for the following components:      Result Value   Cholesterol 203 (*)    Triglycerides 153 (*)    HDL 28 (*)    LDL Cholesterol  144 (*)    All other components within normal limits  BASIC METABOLIC PANEL - Abnormal; Notable for the following components:   BUN 31 (*)    Creatinine, Ser 2.78 (*)    GFR calc non Af Amer 25 (*)    GFR calc Af Amer 28 (*)    All other components within normal limits  TSH  HEMOGLOBIN A1C    EKG None  Radiology CT Angio Abd/Pel W and/or Wo Contrast  Result Date: 09/06/2019 CLINICAL DATA:  Persistent left lower quadrant pain EXAM: CTA ABDOMEN AND PELVIS WITHOUT AND WITH CONTRAST TECHNIQUE: Multidetector CT imaging of the abdomen and pelvis was performed using the standard protocol during bolus administration of intravenous contrast. Multiplanar reconstructed images and MIPs were obtained and reviewed to evaluate the vascular anatomy. CONTRAST:  OMNIPAQUE IOHEXOL 350 MG/ML SOLN COMPARISON:  CT from the previous day. FINDINGS: VASCULAR Aorta: Mild atherosclerotic changes are noted without aneurysmal dilatation or dissection. Celiac: Mild median arcuate ligament compression is noted although no focal stenotic changes are seen. SMA: No focal stenosis is noted. No areas of mesenteric ischemia are seen. Renals: Mild atherosclerotic changes are noted in single renal arteries bilaterally. No focal hemodynamically significant stenosis is noted. IMA: Patent without evidence of aneurysm, dissection, vasculitis or significant stenosis. Iliacs: Atherosclerotic changes are noted without aneurysmal dilatation or focal stenosis. Veins: No venous abnormality is noted. Review of the MIP images confirms the above findings. NON-VASCULAR Lower chest: No acute abnormality. Hepatobiliary: Liver is again within normal limits. Vicarious excretion of contrast material is noted within the gallbladder. Pancreas: Unremarkable. No pancreatic ductal dilatation or surrounding inflammatory changes. Spleen: Normal in size without focal abnormality. Adrenals/Urinary Tract: Adrenal glands are within normal limits. Kidneys  demonstrate a normal enhancement pattern bilaterally. No renal calculi or obstructive changes are seen. The ureters are within normal limits. The bladder is partially distended. Stomach/Bowel: Mild diverticular change of the colon is again identified without evidence of diverticulitis. No obstructive or inflammatory changes are seen. The appendix is within normal limits. No small bowel or gastric abnormality is seen with the exception of a small sliding-type hiatal hernia. Lymphatic: No specific lymphadenopathy is noted. Reproductive: Prostate is unremarkable. Other: No abdominal wall hernia or abnormality. No abdominopelvic ascites. Musculoskeletal: No acute or significant osseous findings. 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 IMPRESSION: VASCULAR No aortic abnormality or branch abnormality is noted. No findings to suggest visceral ischemia are seen. NON-VASCULAR Changes of diverticulosis without evidence of diverticulitis. The overall appearance of the abdomen and pelvis is stable from the previous day. Electronically Signed   By: Alcide Clever M.D.   On: 09/06/2019 18:31  Procedures Procedures (including critical care time)  Medications Ordered in ED Medications  sucralfate (CARAFATE) tablet 1 g (1 g Oral Given 09/08/19 0848)  pantoprazole (PROTONIX) EC tablet 40 mg (40 mg Oral Given 09/08/19 0848)  dicyclomine (BENTYL) tablet 20 mg (20 mg Oral Given 09/08/19 0848)  methocarbamol (ROBAXIN) tablet 500 mg (500 mg Oral Given 09/07/19 1204)  thiamine (VITAMIN B-1) tablet 100 mg (100 mg Oral Given 09/08/19 0848)  multivitamin with minerals tablet 1 tablet (1 tablet Oral Given 09/08/19 0851)  LORazepam (ATIVAN) tablet 1 mg (1 mg Oral Given 09/08/19 0853)  loperamide (IMODIUM) capsule 2-4 mg (has no administration in time range)  ondansetron (ZOFRAN-ODT) disintegrating tablet 4 mg (4 mg Oral Given 09/07/19 1204)  acetaminophen (TYLENOL) tablet 650 mg (has no administration in time range)   hydrOXYzine (ATARAX/VISTARIL) tablet 25 mg (has no administration in time range)  thiamine (B-1) injection 100 mg (100 mg Intramuscular Given 09/07/19 1158)    ED Course  I have reviewed the triage vital signs and the nursing notes.  Pertinent labs & imaging results that were available during my care of the patient were reviewed by me and considered in my medical decision making (see chart for details).    MDM Rules/Calculators/A&P     CHA2DS2/VAS Stroke Risk Points      N/A >= 2 Points: High Risk  1 - 1.99 Points: Medium Risk  0 Points: Low Risk    A final score could not be computed because of missing components.: Last  Change: N/A     This score determines the patient's risk of having a stroke if the  patient has atrial fibrillation.      This score is not applicable to this patient. Components are not  calculated.                   BP 106/72 (BP Location: Left Arm)   Pulse 87   Temp 98 F (36.7 C) (Oral)   Resp (!) 25   SpO2 98%   Final Clinical Impression(s) / ED Diagnoses Final diagnoses:  AKI (acute kidney injury) (HCC)    Rx / DC Orders ED Discharge Orders    None     12:02 PM Patient sent here from behavioral health due to worsening renal function with a BUN 31 creatinine 2.78.  Patient is at behavioral health for suicidal ideation however he has been seen in the ED twice on consecutive days for left lower quadrant abdominal pain.  He received 2 separate abdominal pelvis CT scan with contrast that shows no acute finding.  Patient currently found to be hypotensive with blood pressure 81/51 and tachycardic with heart rate of 109 however he is afebrile and no hypoxia.  He endorsed abdominal pain nausea and diarrhea which may contribute to impaired renal function secondary to dehydration.  Perhaps worsening renal function may be secondary to IV contrast nephropathy.  2:12 PM Blood pressure and heart rate improved after IV fluid.  Labs otherwise reassuring for the  most part except for evidence of AKI.  Normal CK therefore low suspicion for rhabdo.  Normal lactic acid.  Appreciate consultation from [facility] hospitalist, Dr. Karren Burlyhandra who agrees to admit pt for AKI, suspect IV contrast nephropathy.    Fayrene Helperran, Awa Bachicha, PA-C 09/08/19 1414    Pricilla LovelessGoldston, Scott, MD 09/08/19 [id number]

## 2019-09-08 NOTE — Progress Notes (Signed)
Pt transported to [facility] ED via EMS . Pt alert and oriented, ambulatory, and VS stable at time of transfer.

## 2019-09-09 ENCOUNTER — Encounter (HOSPITAL_COMMUNITY): Payer: Self-pay | Admitting: Internal Medicine

## 2019-09-09 ENCOUNTER — Inpatient Hospital Stay (HOSPITAL_COMMUNITY): Payer: Medicaid Other

## 2019-09-09 DIAGNOSIS — R1031 Right lower quadrant pain: Secondary | ICD-10-CM

## 2019-09-09 DIAGNOSIS — D649 Anemia, unspecified: Secondary | ICD-10-CM

## 2019-09-09 LAB — CBC
HCT: 36.8 % — ABNORMAL LOW (ref 39.0–52.0)
Hemoglobin: 11.9 g/dL — ABNORMAL LOW (ref 13.0–17.0)
MCH: 30.4 pg (ref 26.0–34.0)
MCHC: 32.3 g/dL (ref 30.0–36.0)
MCV: 94.1 fL (ref 80.0–100.0)
Platelets: 290 10*3/uL (ref 150–400)
RBC: 3.91 MIL/uL — ABNORMAL LOW (ref 4.22–5.81)
RDW: 13.1 % (ref 11.5–15.5)
WBC: 5.9 10*3/uL (ref 4.0–10.5)
nRBC: 0 % (ref 0.0–0.2)

## 2019-09-09 LAB — BASIC METABOLIC PANEL
Anion gap: 7 (ref 5–15)
BUN: 23 mg/dL — ABNORMAL HIGH (ref 6–20)
CO2: 24 mmol/L (ref 22–32)
Calcium: 8.3 mg/dL — ABNORMAL LOW (ref 8.9–10.3)
Chloride: 109 mmol/L (ref 98–111)
Creatinine, Ser: 1.39 mg/dL — ABNORMAL HIGH (ref 0.61–1.24)
GFR calc Af Amer: >60 mL/min (ref >60)
GFR calc non Af Amer: 57 mL/min — ABNORMAL LOW (ref >60)
Glucose, Bld: 94 mg/dL (ref 70–99)
Potassium: 3.9 mmol/L (ref 3.5–5.1)
Sodium: 140 mmol/L (ref 135–145)

## 2019-09-09 LAB — SARS CORONAVIRUS 2 (TAT 6-24 HRS): SARS Coronavirus 2: NEGATIVE

## 2019-09-09 MED ORDER — LABETALOL HCL 5 MG/ML IV SOLN
10.0000 mg | Freq: Once | INTRAVENOUS | Status: AC
  Administered 2019-09-09: 10 mg via INTRAVENOUS
  Filled 2019-09-09 (×2): qty 4

## 2019-09-09 MED ORDER — KETOROLAC TROMETHAMINE 15 MG/ML IJ SOLN
15.0000 mg | Freq: Once | INTRAMUSCULAR | Status: AC
  Administered 2019-09-09: 15 mg via INTRAVENOUS
  Filled 2019-09-09: qty 1

## 2019-09-09 MED ORDER — ONDANSETRON HCL 4 MG/2ML IJ SOLN
4.0000 mg | Freq: Four times a day (QID) | INTRAMUSCULAR | Status: DC | PRN
  Administered 2019-09-09 – 2019-09-11 (×6): 4 mg via INTRAVENOUS
  Filled 2019-09-09 (×6): qty 2

## 2019-09-09 MED ORDER — CYCLOBENZAPRINE HCL 5 MG PO TABS: 5.0000 mg | ORAL_TABLET | Freq: Three times a day (TID) | ORAL | Status: DC | PRN

## 2019-09-09 MED ORDER — PROMETHAZINE HCL 25 MG/ML IJ SOLN
25.0000 mg | Freq: Once | INTRAMUSCULAR | Status: AC
  Administered 2019-09-09: 25 mg via INTRAVENOUS
  Filled 2019-09-09: qty 1

## 2019-09-09 MED ORDER — HYDROMORPHONE HCL 1 MG/ML IJ SOLN
0.5000 mg | Freq: Once | INTRAMUSCULAR | Status: AC
  Administered 2019-09-09: 0.5 mg via INTRAVENOUS
  Filled 2019-09-09: qty 0.5

## 2019-09-09 MED ORDER — HYDROMORPHONE HCL 1 MG/ML IJ SOLN
0.5000 mg | INTRAMUSCULAR | Status: DC | PRN
  Administered 2019-09-09 – 2019-09-10 (×5): 0.5 mg via INTRAVENOUS
  Filled 2019-09-09 (×5): qty 0.5

## 2019-09-09 MED ORDER — ENOXAPARIN SODIUM 40 MG/0.4ML ~~LOC~~ SOLN
40.0000 mg | SUBCUTANEOUS | Status: DC
  Administered 2019-09-09 – 2019-09-11 (×3): 40 mg via SUBCUTANEOUS
  Filled 2019-09-09 (×3): qty 0.4

## 2019-09-09 MED ORDER — AMLODIPINE BESYLATE 5 MG PO TABS
5.0000 mg | ORAL_TABLET | Freq: Every day | ORAL | Status: DC
  Administered 2019-09-10 – 2019-09-12 (×3): 5 mg via ORAL
  Filled 2019-09-09 (×4): qty 1

## 2019-09-09 NOTE — Progress Notes (Signed)
PT Cancellation Note  Patient Details Name: Tim Morales MRN: [id number] DOB: 1966/01/19   Cancelled Treatment:    Reason Eval/Treat Not Completed: Fatigue/lethargy limiting ability to participate. Pt has just had dilaudid for pain as well as phenergan for nausea, he is very sleepy but arousable and states he "needs to rest", requesting  PT come back another time. Will continue efforts    [facility] 09/09/2019, 11:18 AM

## 2019-09-09 NOTE — Progress Notes (Signed)
Patient has repeatedly refused to go to Radiology for CT scan. Additionally, patient has refused to have vital signs taken and to participate in therapy services. Dr. Neysa Bonito notified via chat.

## 2019-09-09 NOTE — Progress Notes (Signed)
Patient verbalized abdominal pain 10/10. Sitting on side of bed groaning with emesis bag. Refused oral pain and muscle relaxant medications. States he wants IV Dilaudid. Dr. Neysa Bonito paged.

## 2019-09-09 NOTE — Progress Notes (Addendum)
OT Cancellation Note  Patient Details Name: Tim Morales MRN: [id number] DOB: [birth year]   Cancelled Treatment:    Reason Eval/Treat Not Completed: Other (comment). Pt recently declined mobilizing with PT. Pt stating "needs to rest". Plan to reattempt this afternoon.   Tyrone Schimke, OT Acute Rehabilitation Services Pager: [phone] Office: [phone]  09/09/2019, 11:43 AM    Addendum: Second attempt made at 14:35. Pt immediately refusing therapy upon arrival of OT. "I'm not getting up. I'm resting" "I don't want to talk right now." Plan to reattempt tomorrow.

## 2019-09-09 NOTE — Progress Notes (Signed)
PROGRESS NOTE    Tim Morales    Code Status: Full Code  ZOX:[id number] DOB: 30-Mar-1966 DOA: 09/07/2019  PCP: Patient, No Pcp Per    Hospital Summary  This is a 53 year old male with past medical history of depression and suicidal ideation currently at behavioral health, hypertension, chronic back pain, opiate use [facility].  Has been having ongoing left lower quadrant abdominal pain x3 days and has had 2 CT scans, one routine with contrast and 1 follow-up CTA, neither of which provided any etiology of ongoing pain.  A & P   Active Problems:   Severe recurrent major depression without psychotic features (HCC)   AKI (acute kidney injury) (HCC)   AKI, multifactorial: Prerenal and intrarenal etiology dehydration hypotension episode in setting of likely contrast induced nephropathy from to recent contrast studies for abdominal pain evaluation.  Also was on lisinopril.  Creatinine 2.78 on admission, currently 1.4 and baseline 1.2 -Continue IV fluids -Follow-up in a.m. -Monitor I's/O -Hold lisinopril  Severe left lower quadrant abdominal pain of unknown etiology patient multiple times today regarding pain.  Hunched over the side of the bed.  Having nausea and an episode of vomiting and tearful.  Improved with 0.5 mg Dilaudid which lasted roughly 5 hours.  Pain seeking behaviors?  No colonoscopy on file, patient is 989.  Diverticulosis without diverticulitis on CT 12/9.  Had a white count of 14.7 on 12/9 which decreased to 11.2 and currently without leukocytosis. -0.5 mg Dilaudid IV q. 4 hours as needed for severe pain and Tylenol for mild pain -will get CT abdomen pelvis without contrast patient agreed to after further discussion -Zofran for nausea as needed -On Bentyl as needed, Imodium as needed, sucralfate 3 times daily, Zofran as needed, Protonix, Senokot as needed -Consider GI consult for possible scope  Normocytic anemia dropped from 14.0-11.9 from yesterday, possibly  dilutional as WBC and platelets also decreased in similar fashion -Iron studies -consider GI consult as above  Possible opioid withdrawal noted on admission and now having severe pain.  Possibly etiology of his pain? -Continue to monitor  Hypertension stable -Hold lisinopril  [facility] -We will need to go back to behavioral health at discharge -Continue one-to-one  Chronic back pain refused PT today.  If uses Robaxin -will continue to assess his etiology of abdominal pain and transition off opiates to other pain regimen during hospital stay   DVT prophylaxis: Lovenox Diet: Regular Family Communication: No family at bedside Disposition Plan: Inpatient for pain management and AKI treatment  Consultants  None  Procedures  None  Antibiotics  None      Subjective   Patient on multiple occasions throughout the day for severe abdominal pain.  Patient evaluated at bedside hunched over the side of the bed and tearful.  Admitted to having nausea and vomiting this afternoon as well.  Sitter at bedside.  Reports his pain is improved with Dilaudid.  Patient had refused going for scan today.  I reevaluated the patient at bedside and discussed this pain regimen with him.  I stated that he will be difficult to get his pain down to 0 and that is not the goal but rather to help him tolerate his pain.  He understood this.  He denied any other complaints.  Objective   Vitals:   09/08/19 1829 09/08/19 1829 09/08/19 2134 09/09/19 0622  BP: 105/71 105/71 126/79 (!) 133/94  Pulse: 85 85 94 84  Resp: 20 20 18  20  Temp: 97.9 F (36.6 C) 97.9 F (36.6 C) 98.1 F (36.7 C) 98.2 F (36.8 C)  TempSrc: Oral Oral Oral Oral  SpO2:  98% 98% 99%  Weight: 80.3 kg     Height: 5\' 10"  (1.778 m)       Intake/Output Summary (Last 24 hours) at 09/09/2019 1720 Last data filed at 09/09/2019 1348 Gross per 24 hour  Intake 1284.32 ml  Output 900 ml  Net 384.32 ml   Filed Weights    09/08/19 1829  Weight: 80.3 kg    Examination:  Physical Exam Constitutional:      General: He is in acute distress.  HENT:     Head: Normocephalic and atraumatic.     Mouth/Throat:     Mouth: Mucous membranes are moist.  Eyes:     Extraocular Movements: Extraocular movements intact.  Cardiovascular:     Rate and Rhythm: Normal rate and regular rhythm.  Pulmonary:     Effort: Pulmonary effort is normal.     Breath sounds: Normal breath sounds.  Abdominal:     General: Abdomen is flat.     Tenderness: There is abdominal tenderness in the left lower quadrant.  Musculoskeletal:     Cervical back: Normal range of motion.  Neurological:     Mental Status: He is alert.     Data Reviewed: I have personally reviewed following labs and imaging studies  CBC: Recent Labs  Lab 09/05/19 1323 09/06/19 1519 09/08/19 1321 09/09/19 0807  WBC 14.7* 11.2* 7.4 5.9  NEUTROABS  --  8.7* 4.8  --   HGB 15.6 14.7 14.0 11.9*  HCT 46.1 44.1 42.3 36.8*  MCV 91.8 91.1 92.2 94.1  PLT 473* 420* 390 290   Basic Metabolic Panel: Recent Labs  Lab 09/05/19 1323 09/06/19 1519 09/08/19 0659 09/08/19 1829 09/09/19 0807  NA 137 139 137  --  140  K 4.1 3.8 4.6  --  3.9  CL 99 102 100  --  109  CO2 28 22 26   --  24  GLUCOSE 117* 104* 97  --  94  BUN 30* 24* 31*  --  23*  CREATININE 1.94* 1.44* 2.78* 1.95* 1.39*  CALCIUM 9.4 9.1 9.5  --  8.3*   GFR: Estimated Creatinine Clearance: 62 mL/min (A) (by C-G formula based on SCr of 1.39 mg/dL (H)). Liver Function Tests: Recent Labs  Lab 09/05/19 1323 09/06/19 1519  AST 12* 13*  ALT 12 13  ALKPHOS 53 57  BILITOT 0.9 1.1  PROT 7.9 8.1  ALBUMIN 4.3 4.4   Recent Labs  Lab 09/05/19 1323 09/06/19 1519  LIPASE 28 24   No results for input(s): AMMONIA in the last 168 hours. Coagulation Profile: No results for input(s): INR, PROTIME in the last 168 hours. Cardiac Enzymes: Recent Labs  Lab 09/06/19 1519 09/08/19 1321  CKTOTAL 28* 63     BNP (last 3 results) No results for input(s): PROBNP in the last 8760 hours. HbA1C: Recent Labs    09/08/19 0659  HGBA1C 5.3   CBG: No results for input(s): GLUCAP in the last 168 hours. Lipid Profile: Recent Labs    09/08/19 0659  CHOL 203*  HDL 28*  LDLCALC 144*  TRIG 153*  CHOLHDL 7.3   Thyroid Function Tests: Recent Labs    09/08/19 0659  TSH 1.226   Anemia Panel: No results for input(s): VITAMINB12, FOLATE, FERRITIN, TIBC, IRON, RETICCTPCT in the last 72 hours. Sepsis Labs: Recent Labs  Lab 09/06/19 1520 09/06/19  1744 09/08/19 1321 09/08/19 1829  LATICACIDVEN 1.7 0.9 1.0 1.3    Recent Results (from the past 240 hour(s))  SARS CORONAVIRUS 2 (TAT 6-24 HRS) Nasopharyngeal Nasopharyngeal Swab     Status: None   Collection Time: 09/08/19  5:44 PM   Specimen: Nasopharyngeal Swab  Result Value Ref Range Status   SARS Coronavirus 2 NEGATIVE NEGATIVE Final    Comment: (NOTE) SARS-CoV-2 target nucleic acids are NOT DETECTED. The SARS-CoV-2 RNA is generally detectable in upper and lower respiratory specimens during the acute phase of infection. Negative results do not preclude SARS-CoV-2 infection, do not rule out co-infections with other pathogens, and should not be used as the sole basis for treatment or other patient management decisions. Negative results must be combined with clinical observations, patient history, and epidemiological information. The expected result is Negative. Fact Sheet for Patients: SugarRoll.be Fact Sheet for Healthcare Providers: https://www.woods-mathews.com/ This test is not yet approved or cleared by the [place] FDA and  has been authorized for detection and/or diagnosis of SARS-CoV-2 by FDA under an Emergency Use Authorization (EUA). This EUA will remain  in effect (meaning this test can be used) for the duration of the COVID-19 declaration under Section 56 4(b)(1) of the Act, 21  U.S.C. section 360bbb-3(b)(1), unless the authorization is terminated or revoked sooner. Performed at [facility], [place] [address] [place], [place] [address]          Radiology Studies: DG Chest Portable 1 View  Result Date: 09/08/2019 CLINICAL DATA:  Short of breath.  Weakness. EXAM: PORTABLE CHEST 1 VIEW COMPARISON:  08/04/2019 FINDINGS: Cardiac silhouette is normal in size. Normal mediastinal and hilar contours. Lungs are clear.  No pleural effusion or pneumothorax. Skeletal structures are grossly intact. IMPRESSION: No active disease. Electronically Signed   By: Lajean Manes M.D.   On: 09/08/2019 12:37        Scheduled Meds: . heparin  5,000 Units Subcutaneous Q8H  . multivitamin with minerals  1 tablet Oral Daily  . pantoprazole  40 mg Oral Daily  . sucralfate  1 g Oral TID WC & HS  . thiamine  100 mg Oral Daily   Continuous Infusions: . sodium chloride 125 mL/hr at 09/09/19 0332     LOS: 2 days    Time spent: 40 minutes with over 50% of the time coordinating the patient's care    Harold Hedge, DO [facility] Pager [phone]  If 7PM-7AM, please contact night-coverage [email or web address] Password [email or web address] 09/09/2019, 5:20 PM

## 2019-09-10 LAB — BASIC METABOLIC PANEL
Anion gap: 9 (ref 5–15)
BUN: 10 mg/dL (ref 6–20)
CO2: 24 mmol/L (ref 22–32)
Calcium: 8.5 mg/dL — ABNORMAL LOW (ref 8.9–10.3)
Chloride: 105 mmol/L (ref 98–111)
Creatinine, Ser: 1.11 mg/dL (ref 0.61–1.24)
GFR calc Af Amer: >60 mL/min (ref >60)
GFR calc non Af Amer: >60 mL/min (ref >60)
Glucose, Bld: 91 mg/dL (ref 70–99)
Potassium: 3.4 mmol/L — ABNORMAL LOW (ref 3.5–5.1)
Sodium: 138 mmol/L (ref 135–145)

## 2019-09-10 LAB — URINALYSIS, ROUTINE W REFLEX MICROSCOPIC
Bacteria, UA: NONE SEEN
Bilirubin Urine: NEGATIVE
Glucose, UA: >=500 mg/dL — AB
Hgb urine dipstick: NEGATIVE
Ketones, ur: NEGATIVE mg/dL
Leukocytes,Ua: NEGATIVE
Nitrite: NEGATIVE
Protein, ur: 30 mg/dL — AB
Specific Gravity, Urine: 1.026 (ref 1.005–1.030)
pH: 6 (ref 5.0–8.0)

## 2019-09-10 LAB — CBC
HCT: 37.1 % — ABNORMAL LOW (ref 39.0–52.0)
Hemoglobin: 12.2 g/dL — ABNORMAL LOW (ref 13.0–17.0)
MCH: 30.5 pg (ref 26.0–34.0)
MCHC: 32.9 g/dL (ref 30.0–36.0)
MCV: 92.8 fL (ref 80.0–100.0)
Platelets: 304 10*3/uL (ref 150–400)
RBC: 4 MIL/uL — ABNORMAL LOW (ref 4.22–5.81)
RDW: 12.9 % (ref 11.5–15.5)
WBC: 5.6 10*3/uL (ref 4.0–10.5)
nRBC: 0.7 % — ABNORMAL HIGH (ref 0.0–0.2)

## 2019-09-10 LAB — IRON AND TIBC
Iron: 104 ug/dL (ref 45–182)
Saturation Ratios: 42 % — ABNORMAL HIGH (ref 17.9–39.5)
TIBC: 249 ug/dL — ABNORMAL LOW (ref 250–450)
UIBC: 145 ug/dL

## 2019-09-10 LAB — FERRITIN: Ferritin: 99 ng/mL (ref 24–336)

## 2019-09-10 MED ORDER — QUETIAPINE FUMARATE 25 MG PO TABS
50.0000 mg | ORAL_TABLET | Freq: Every day | ORAL | Status: DC
  Administered 2019-09-10 – 2019-09-11 (×2): 50 mg via ORAL
  Filled 2019-09-10 (×2): qty 2

## 2019-09-10 MED ORDER — POTASSIUM CHLORIDE CRYS ER 20 MEQ PO TBCR
20.0000 meq | EXTENDED_RELEASE_TABLET | Freq: Once | ORAL | Status: AC
  Administered 2019-09-10: 20 meq via ORAL
  Filled 2019-09-10: qty 1

## 2019-09-10 MED ORDER — KETOROLAC TROMETHAMINE 15 MG/ML IJ SOLN
15.0000 mg | Freq: Four times a day (QID) | INTRAMUSCULAR | Status: DC | PRN
  Administered 2019-09-10: 15 mg via INTRAVENOUS
  Filled 2019-09-10: qty 1

## 2019-09-10 MED ORDER — HYDROCODONE-ACETAMINOPHEN 5-325 MG PO TABS: 1.0000 | ORAL_TABLET | ORAL | Status: DC | PRN

## 2019-09-10 MED ORDER — HYDROCODONE-ACETAMINOPHEN 5-325 MG PO TABS
1.0000 | ORAL_TABLET | ORAL | Status: DC | PRN
  Administered 2019-09-11 – 2019-09-12 (×3): 2 via ORAL
  Filled 2019-09-10 (×4): qty 2

## 2019-09-10 MED ORDER — KETOROLAC TROMETHAMINE 15 MG/ML IJ SOLN
15.0000 mg | Freq: Four times a day (QID) | INTRAMUSCULAR | Status: AC | PRN
  Administered 2019-09-10 – 2019-09-11 (×3): 15 mg via INTRAVENOUS
  Filled 2019-09-10 (×3): qty 1

## 2019-09-10 NOTE — Progress Notes (Signed)
PROGRESS NOTE    Tim Morales    Code Status: Full Code  ZSW:[id number] DOB: November 09, 1965 DOA: 09/07/2019  PCP: Patient, No Pcp Per    Hospital Summary  This is a 53 year old male with past medical history of depression and suicidal ideation currently at behavioral health, hypertension, chronic back pain, opiate use presented from [facility] for AKI.  Has been having ongoing left lower quadrant abdominal pain x3 days and has had 2 CT scans, one routine with contrast and 1 follow-up CTA, neither of which provided any etiology of ongoing pain.  A & P   Active Problems:   Severe recurrent major depression without psychotic features (HCC)   AKI (acute kidney injury) (HCC)   AKI, multifactorial: Prerenal and intrarenal etiology dehydration hypotension episode in setting of likely contrast induced nephropathy from to recent contrast studies for abdominal pain evaluation.  Also was on lisinopril.  Creatinine 2.78 on admission, currently 1.1, at baseline. -Monitor now that he has received Toradol multiple doses -Monitor I's/O -Hold lisinopril  Severe left lower quadrant abdominal pain of unknown etiology improved and well controlled with Toradol and IV Dilaudid.  Initially having nausea and an episode of vomiting and tearful.  Though he does have a history of opiate abuse, he does appear to true pain despite some pain seeking behaviors.  We discussed that he is not likely going to be able to be discharged with opiates and we are going to have to take these off during his hospitalization.  He was okay with this and stated that Toradol helped his pain better than Dilaudid.  Never had a colonoscopy, patient is 53.  Multiple CTs negative.  Had a white count of 14.7 on 12/9 which resolved.  Maybe he had an episode of diverticulitis? -discontinue IV Dilaudid, continue Toradol 15 mg IV for severe pain and Norco for moderate pain -will get CT abdomen pelvis without contrast patient agreed to after further  discussion -Zofran for nausea as needed -On Bentyl as needed, Imodium as needed, sucralfate 3 times daily, Zofran as needed, Protonix, Senokot as needed.  Stop Bentyl, Imodium and Carafate as this is not working -HCV screen (has tattoos from prison, routine screening) -Recommend GI consult if he has persistent pain otherwise GI referral at discharge  Normocytic anemia dropped from 14.0-11.9, now 12.2, possibly dilutional as WBC and platelets also decreased in similar fashion -Iron studies -consider GI consult as above  Possible opioid withdrawal noted on admission and now having severe pain.  Possibly etiology of his pain? -Continue to monitor  Hypertension stable.  Received labetalol last night -Hold lisinopril -Change amlodipine  Suicidal ideation from [facility] -will need to go back to behavioral health at discharge -Continue one-to-one  Chronic back pain cleared by PT   DVT prophylaxis: Lovenox Diet: Regular Family Communication: No family at bedside Disposition Plan: Inpatient for pain management.  Likely discharge in the morning   Consultants  None  Procedures  None  Antibiotics  None      Subjective  He is resting much more comfortable today.  States that Toradol helped a lot last night and lasted longer than Dilaudid.  Reports he has never had a colonoscopy as he is nervous to have anesthesia and have a scope.  I explained to him the process and he was more comfortable with it.  Has been tolerating his diet and has had improvement in nausea and vomiting.  Continues to report that he can feel his pain coming.  I advised him to  take pain meds before his pain became severe.  Nurse noted later in the evening he had improved pain but he is still been requiring IV meds.  Also reports that he has no history of hepatitis C but his sister did before she fortunately committed suicide.  He has multiple tattoos which he received while he was in prison.  Otherwise denies chest pain,  shortness of breath, nausea, vomiting.  No other complaints Objective   Vitals:   09/09/19 2128 09/10/19 0620 09/10/19 0829 09/10/19 1400  BP: (!) 161/115 (!) 130/99 (!) 136/93 134/88  Pulse: 94 71 70 88  Resp: 17 16  18   Temp: 98.6 F (37 C) 98.1 F (36.7 C)  98.5 F (36.9 C)  TempSrc:  Oral  Oral  SpO2: 98% 97% 98% 97%  Weight:      Height:        Intake/Output Summary (Last 24 hours) at 09/10/2019 1757 Last data filed at 09/10/2019 1330 Gross per 24 hour  Intake 360 ml  Output --  Net 360 ml   Filed Weights   09/08/19 1829  Weight: 80.3 kg    Examination:  Physical Exam Vitals and nursing note reviewed.  Constitutional:      Appearance: Normal appearance.  HENT:     Head: Normocephalic and atraumatic.     Nose: Nose normal.     Mouth/Throat:     Mouth: Mucous membranes are moist.  Eyes:     Extraocular Movements: Extraocular movements intact.  Cardiovascular:     Rate and Rhythm: Normal rate and regular rhythm.  Pulmonary:     Effort: Pulmonary effort is normal.     Breath sounds: Normal breath sounds.  Abdominal:     General: Abdomen is flat.     Palpations: Abdomen is soft.     Comments: Left lower quadrant pain  Musculoskeletal:        General: No swelling. Normal range of motion.     Cervical back: Normal range of motion. No rigidity.  Neurological:     General: No focal deficit present.     Mental Status: He is alert. Mental status is at baseline.  Psychiatric:        Mood and Affect: Mood normal.        Behavior: Behavior normal.     Data Reviewed: I have personally reviewed following labs and imaging studies  CBC: Recent Labs  Lab 09/05/19 1323 09/06/19 1519 09/08/19 1321 09/09/19 0807 09/10/19 0613  WBC 14.7* 11.2* 7.4 5.9 5.6  NEUTROABS  --  8.7* 4.8  --   --   HGB 15.6 14.7 14.0 11.9* 12.2*  HCT 46.1 44.1 42.3 36.8* 37.1*  MCV 91.8 91.1 92.2 94.1 92.8  PLT 473* 420* 390 290 304   Basic Metabolic Panel: Recent Labs  Lab  09/05/19 1323 09/06/19 1519 09/08/19 0659 09/08/19 1829 09/09/19 0807 09/10/19 0613  NA 137 139 137  --  140 138  K 4.1 3.8 4.6  --  3.9 3.4*  CL 99 102 100  --  109 105  CO2 28 22 26   --  24 24  GLUCOSE 117* 104* 97  --  94 91  BUN 30* 24* 31*  --  23* 10  CREATININE 1.94* 1.44* 2.78* 1.95* 1.39* 1.11  CALCIUM 9.4 9.1 9.5  --  8.3* 8.5*   GFR: Estimated Creatinine Clearance: 77.6 mL/min (by C-G formula based on SCr of 1.11 mg/dL). Liver Function Tests: Recent Labs  Lab 09/05/19 1323  09/06/19 1519  AST 12* 13*  ALT 12 13  ALKPHOS 53 57  BILITOT 0.9 1.1  PROT 7.9 8.1  ALBUMIN 4.3 4.4   Recent Labs  Lab 09/05/19 1323 09/06/19 1519  LIPASE 28 24   No results for input(s): AMMONIA in the last 168 hours. Coagulation Profile: No results for input(s): INR, PROTIME in the last 168 hours. Cardiac Enzymes: Recent Labs  Lab 09/06/19 1519 09/08/19 1321  CKTOTAL 28* 63   BNP (last 3 results) No results for input(s): PROBNP in the last 8760 hours. HbA1C: Recent Labs    09/08/19 0659  HGBA1C 5.3   CBG: No results for input(s): GLUCAP in the last 168 hours. Lipid Profile: Recent Labs    09/08/19 0659  CHOL 203*  HDL 28*  LDLCALC 144*  TRIG 153*  CHOLHDL 7.3   Thyroid Function Tests: Recent Labs    09/08/19 0659  TSH 1.226   Anemia Panel: Recent Labs    09/10/19 0613  FERRITIN 99  TIBC 249*  IRON 104   Sepsis Labs: Recent Labs  Lab 09/06/19 1520 09/06/19 1744 09/08/19 1321 09/08/19 1829  LATICACIDVEN 1.7 0.9 1.0 1.3    Recent Results (from the past 240 hour(s))  SARS CORONAVIRUS 2 (TAT 6-24 HRS) Nasopharyngeal Nasopharyngeal Swab     Status: None   Collection Time: 09/08/19  5:44 PM   Specimen: Nasopharyngeal Swab  Result Value Ref Range Status   SARS Coronavirus 2 NEGATIVE NEGATIVE Final    Comment: (NOTE) SARS-CoV-2 target nucleic acids are NOT DETECTED. The SARS-CoV-2 RNA is generally detectable in upper and lower respiratory  specimens during the acute phase of infection. Negative results do not preclude SARS-CoV-2 infection, do not rule out co-infections with other pathogens, and should not be used as the sole basis for treatment or other patient management decisions. Negative results must be combined with clinical observations, patient history, and epidemiological information. The expected result is Negative. Fact Sheet for Patients: SugarRoll.be Fact Sheet for Healthcare Providers: https://www.woods-mathews.com/ This test is not yet approved or cleared by the [place] FDA and  has been authorized for detection and/or diagnosis of SARS-CoV-2 by FDA under an Emergency Use Authorization (EUA). This EUA will remain  in effect (meaning this test can be used) for the duration of the COVID-19 declaration under Section 56 4(b)(1) of the Act, 21 U.S.C. section 360bbb-3(b)(1), unless the authorization is terminated or revoked sooner. Performed at [facility], [place] [address] [place], [place] [address]          Radiology Studies: CT ABDOMEN PELVIS WO CONTRAST  Result Date: 09/09/2019 CLINICAL DATA:  Left lower quadrant pain EXAM: CT ABDOMEN AND PELVIS WITHOUT CONTRAST TECHNIQUE: Multidetector CT imaging of the abdomen and pelvis was performed following the standard protocol without IV contrast. COMPARISON:  09/06/2019 FINDINGS: Lower chest: Minimal basilar atelectasis is noted. Hepatobiliary: No focal liver abnormality is seen. No gallstones, gallbladder wall thickening, or biliary dilatation. Pancreas: Unremarkable. No pancreatic ductal dilatation or surrounding inflammatory changes. Spleen: Normal in size without focal abnormality. Adrenals/Urinary Tract: Adrenal glands are within normal limits. Kidneys are well visualized bilaterally. No renal calculi or obstructive changes are seen. The bladder is partially distended. Stomach/Bowel: The colon again  demonstrates some mild diverticular change without evidence of diverticulitis. The appendix is within normal limits. The small bowel shows no obstructive or inflammatory changes. Stomach is decompressed. Vascular/Lymphatic: Aortic atherosclerosis. No enlarged abdominal or pelvic lymph nodes. Reproductive: Prostate is unremarkable. Other: No abdominal wall hernia  or abnormality. No abdominopelvic ascites. Musculoskeletal: No acute bony abnormality is noted. 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 IMPRESSION: Mild bibasilar atelectasis. Diverticulosis without diverticulitis stable from the prior exam. No acute abnormality is noted to correspond with the patient's given clinical symptomatology. Electronically Signed   By: Alcide Clever M.D.   On: 09/09/2019 21:03        Scheduled Meds:  amLODipine  5 mg Oral Daily   enoxaparin (LOVENOX) injection  40 mg Subcutaneous Q24H   multivitamin with minerals  1 tablet Oral Daily   pantoprazole  40 mg Oral Daily   QUEtiapine  50 mg Oral QHS   sucralfate  1 g Oral TID WC & HS   thiamine  100 mg Oral Daily   Continuous Infusions:    LOS: 3 days    Time spent: with over 50% of the time coordinating the patient's care    Jae Dire, DO [facility] Pager [phone]  If 7PM-7AM, please contact night-coverage [email or web address] Password [email or web address] 09/10/2019, 5:57 PM

## 2019-09-10 NOTE — Evaluation (Signed)
Occupational Therapy Evaluation Patient Details Name: Tim Morales MRN: [id number] DOB: Oct 09, 1965 Today's Date: 09/10/2019    History of Present Illness Tim Morales is a 53 y.o. male with medical history significant of depression, HTN, Chronic back pain c/b opiate use who presents from behavioral health for AKI.  Patient has been complaining of ongoing LLQ abdominal pain for 3 days now and had been evaluated with CT scans twice, once routine and follow-up with CTA, neither of which provided any etiology of his ongoing pain.  He reports in the past 24 hours he has not had much diarrhea but he reports increased urination and some dysuria yesterday.  He denies any fevers, chills, cough, sob, chest pain.  He denies any use of NSAIDs recently. Patient still reporting SI, will need to transfer back to [facility]. Denies tobacco use, states he uses alcohol twice a week (2 beers, no binges), he reports prior hx of cocaine and opiates - then reported he has multiple prescriptions for opiates which he has not abused - stating he has oxycodone, morphine   Clinical Impression   Pt overall I with ADL activity.  No further OT needs    Follow Up Recommendations  No OT follow up    Equipment Recommendations  None recommended by OT    Recommendations for Other Services       Precautions / Restrictions Precautions Precautions: None Restrictions Weight Bearing Restrictions: No      Mobility Bed Mobility Overal bed mobility: Independent             General bed mobility comments: able to come to EOB without bedrail or increased time, no increased abdominal pain with mobility  Transfers Overall transfer level: Independent Equipment used: None             General transfer comment: able to rise from EOB without unsteadiness noted, reports dizziness upon standing that resolves with time in standing    Balance Overall balance assessment: Independent                                          ADL either performed or assessed with clinical judgement   ADL Overall ADL's : Independent                                             Vision Patient Visual Report: No change from baseline              Pertinent Vitals/Pain Pain Assessment: No/denies pain Pain Score: 3  Pain Location: "in my intestines" Pain Descriptors / Indicators: Aching Pain Intervention(s): Limited activity within patient's tolerance;Monitored during session;Premedicated before session     Hand Dominance     Extremity/Trunk Assessment Upper Extremity Assessment Upper Extremity Assessment: Overall WFL for tasks assessed   Lower Extremity Assessment Lower Extremity Assessment: Overall WFL for tasks assessed   Cervical / Trunk Assessment Cervical / Trunk Assessment: Normal   Communication Communication Communication: No difficulties   Cognition Arousal/Alertness: Awake/alert Behavior During Therapy: WFL for tasks assessed/performed Overall Cognitive Status: Within Functional Limits for tasks assessed  Home Living Family/patient expects to be discharged to:: Private residence Living Arrangements: Spouse/significant other(girlfriend) Available Help at Discharge: Friend(s);Available PRN/intermittently Type of Home: House Home Access: Level entry     Home Layout: One level               Home Equipment: None          Prior Functioning/Environment Level of Independence: Independent        Comments: Pt reports independent with ADLs, ambulates community distances without AD, full time Doctor, hospital, drives. Pt reports occasionally passess out when stands up too quickly, but stands up and continues with his day without issues.                 OT Goals(Current goals can be found in the care plan section) Acute Rehab OT Goals Patient Stated Goal: return home  OT Frequency:       AM-PAC OT "6 Clicks" Daily Activity     Outcome Measure Help from another person eating meals?: None Help from another person taking care of personal grooming?: None Help from another person toileting, which includes using toliet, bedpan, or urinal?: None   Help from another person to put on and taking off regular upper body clothing?: None Help from another person to put on and taking off regular lower body clothing?: None 6 Click Score: 20   End of Session    Activity Tolerance: Patient tolerated treatment well Patient left: in bed;with call bell/phone within reach;with nursing/sitter in room                   Time: 1001-1016 OT Time Calculation (min): 15 min Charges:  OT General Charges $OT Visit: 1 Visit OT Evaluation $OT Eval Low Complexity: 1 Low  Lise Auer, OT Acute Rehabilitation Services [phone] Office- [phone]     Ilhan Madan, Karin Golden D 09/10/2019, 1:06 PM

## 2019-09-10 NOTE — Evaluation (Signed)
Physical Therapy One Time Evaluation Patient Details Name: Tim Morales MRN: [id number] DOB: 1965-12-31 Today's Date: 09/10/2019   History of Present Illness  Tim Morales is a 53 y.o. male with medical history significant of depression, HTN, Chronic back pain c/b opiate use who presents from behavioral health for AKI.  Patient has been complaining of ongoing LLQ abdominal pain for 3 days now and had been evaluated with CT scans twice, once routine and follow-up with CTA, neither of which provided any etiology of his ongoing pain.  He reports in the past 24 hours he has not had much diarrhea but he reports increased urination and some dysuria yesterday.  He denies any fevers, chills, cough, sob, chest pain.  He denies any use of NSAIDs recently. Patient still reporting SI, will need to transfer back to [facility]. Denies tobacco use, states he uses alcohol twice a week (2 beers, no binges), he reports prior hx of cocaine and opiates - then reported he has multiple prescriptions for opiates which he has not abused - stating he has oxycodone, morphine    Clinical Impression  Pt admitted with above diagnosis. Physical therapy evaluation completed, patient is at baseline and no further PT services recommended at this time. Pt able to ambulate in hallway and throughout room without AD, no unsteadiness noted with transfers or gait and no SOB noted. Pt admits this is his baseline and agrees that he doesn't need PT and doesn't want OT services. Pt discharged to care of nursing for ambulation daily as tolerated for length of stay.     Follow Up Recommendations No PT follow up    Equipment Recommendations  None recommended by PT    Recommendations for Other Services       Precautions / Restrictions Precautions Precautions: None Restrictions Weight Bearing Restrictions: No      Mobility  Bed Mobility Overal bed mobility: Independent             General bed mobility comments: able to come to EOB  without bedrail or increased time, no increased abdominal pain with mobility  Transfers Overall transfer level: Independent Equipment used: None             General transfer comment: able to rise from EOB without unsteadiness noted, reports dizziness upon standing that resolves with time in standing  Ambulation/Gait Ambulation/Gait assistance: Independent Gait Distance (Feet): 360 Feet Assistive device: None Gait Pattern/deviations: WFL(Within Functional Limits) Gait velocity: normal   General Gait Details: Normal gait pattern in hallway and navigating room, no unsteadiness or reaching for furniture to maintain balance, pt denies SOB and denies increase in abdominal pain with mobility  Stairs            Wheelchair Mobility    Modified Rankin (Stroke Patients Only)       Balance Overall balance assessment: Independent                  Pertinent Vitals/Pain Pain Assessment: 0-10 Pain Score: 3  Pain Location: "in my intestines" Pain Descriptors / Indicators: Aching Pain Intervention(s): Limited activity within patient's tolerance;Monitored during session;Premedicated before session    Home Living Family/patient expects to be discharged to:: Private residence Living Arrangements: Spouse/significant other(girlfriend) Available Help at Discharge: Friend(s);Available PRN/intermittently Type of Home: House Home Access: Level entry     Home Layout: One level Home Equipment: None      Prior Function Level of Independence: Independent         Comments: Pt reports independent with  ADLs, ambulates community distances without AD, full time Teacher, music, drives. Pt reports occasionally passess out when stands up too quickly, but stands up and continues with his day without issues.     Hand Dominance        Extremity/Trunk Assessment   Upper Extremity Assessment Upper Extremity Assessment: Overall WFL for tasks assessed    Lower Extremity  Assessment Lower Extremity Assessment: Overall WFL for tasks assessed    Cervical / Trunk Assessment Cervical / Trunk Assessment: Normal  Communication   Communication: No difficulties  Cognition Arousal/Alertness: Awake/alert Behavior During Therapy: WFL for tasks assessed/performed Overall Cognitive Status: Within Functional Limits for tasks assessed         General Comments      Exercises     Assessment/Plan    PT Assessment Patent does not need any further PT services  PT Problem List         PT Treatment Interventions      PT Goals (Current goals can be found in the Care Plan section)  Acute Rehab PT Goals Patient Stated Goal: return home PT Goal Formulation: With patient Time For Goal Achievement: 09/10/19 Potential to Achieve Goals: Good    Frequency     Barriers to discharge        Co-evaluation       AM-PAC PT "6 Clicks" Mobility  Outcome Measure Help needed turning from your back to your side while in a flat bed without using bedrails?: None Help needed moving from lying on your back to sitting on the side of a flat bed without using bedrails?: None Help needed moving to and from a bed to a chair (including a wheelchair)?: None Help needed standing up from a chair using your arms (e.g., wheelchair or bedside chair)?: None Help needed to walk in hospital room?: None Help needed climbing 3-5 steps with a railing? : None 6 Click Score: 24    End of Session   Activity Tolerance: Patient tolerated treatment well;No increased pain Patient left: in bed;with call bell/phone within reach;with nursing/sitter in room Nurse Communication: Mobility status PT Visit Diagnosis: Other abnormalities of gait and mobility (R26.89)    Time: 8250-5397 PT Time Calculation (min) (ACUTE ONLY): 10 min   Charges:   PT Evaluation $PT Eval Low Complexity: 1 Low          Tori Shaqueena Mauceri PT, DPT 09/10/19, 10:52 AM [phone]

## 2019-09-11 DIAGNOSIS — R1032 Left lower quadrant pain: Secondary | ICD-10-CM

## 2019-09-11 DIAGNOSIS — N179 Acute kidney failure, unspecified: Secondary | ICD-10-CM

## 2019-09-11 LAB — CBC
HCT: 37.7 % — ABNORMAL LOW (ref 39.0–52.0)
Hemoglobin: 12.3 g/dL — ABNORMAL LOW (ref 13.0–17.0)
MCH: 30.4 pg (ref 26.0–34.0)
MCHC: 32.6 g/dL (ref 30.0–36.0)
MCV: 93.1 fL (ref 80.0–100.0)
Platelets: 295 10*3/uL (ref 150–400)
RBC: 4.05 MIL/uL — ABNORMAL LOW (ref 4.22–5.81)
RDW: 13.1 % (ref 11.5–15.5)
WBC: 5.3 10*3/uL (ref 4.0–10.5)
nRBC: 0 % (ref 0.0–0.2)

## 2019-09-11 LAB — BASIC METABOLIC PANEL
Anion gap: 7 (ref 5–15)
BUN: 16 mg/dL (ref 6–20)
CO2: 28 mmol/L (ref 22–32)
Calcium: 8.8 mg/dL — ABNORMAL LOW (ref 8.9–10.3)
Chloride: 105 mmol/L (ref 98–111)
Creatinine, Ser: 1.21 mg/dL (ref 0.61–1.24)
GFR calc Af Amer: >60 mL/min (ref >60)
GFR calc non Af Amer: >60 mL/min (ref >60)
Glucose, Bld: 100 mg/dL — ABNORMAL HIGH (ref 70–99)
Potassium: 3.6 mmol/L (ref 3.5–5.1)
Sodium: 140 mmol/L (ref 135–145)

## 2019-09-11 LAB — HCV COMMENT:

## 2019-09-11 LAB — HEPATITIS C ANTIBODY (REFLEX): HCV Ab: 0.2 s/co ratio (ref 0.0–0.9)

## 2019-09-11 LAB — MAGNESIUM: Magnesium: 1.9 mg/dL (ref 1.7–2.4)

## 2019-09-11 MED ORDER — IBUPROFEN 200 MG PO TABS
400.0000 mg | ORAL_TABLET | Freq: Four times a day (QID) | ORAL | Status: DC | PRN
  Filled 2019-09-11: qty 2

## 2019-09-11 MED ORDER — SENNOSIDES-DOCUSATE SODIUM 8.6-50 MG PO TABS: 2.0000 | ORAL_TABLET | Freq: Every day | ORAL | 1 refills | Status: DC | PRN

## 2019-09-11 MED ORDER — DOCUSATE SODIUM 100 MG PO CAPS
100.0000 mg | ORAL_CAPSULE | Freq: Every day | ORAL | Status: DC
  Administered 2019-09-11 – 2019-09-12 (×2): 100 mg via ORAL
  Filled 2019-09-11 (×2): qty 1

## 2019-09-11 MED ORDER — AMLODIPINE BESYLATE 5 MG PO TABS: 5.0000 mg | ORAL_TABLET | Freq: Every day | ORAL | 0 refills | Status: DC

## 2019-09-11 MED ORDER — QUETIAPINE FUMARATE 50 MG PO TABS: 50.0000 mg | ORAL_TABLET | Freq: Every day | ORAL | 0 refills | Status: DC

## 2019-09-11 MED ORDER — IBUPROFEN 400 MG PO TABS: 400.0000 mg | ORAL_TABLET | Freq: Four times a day (QID) | ORAL | 0 refills | Status: AC | PRN

## 2019-09-11 MED ORDER — ACETAMINOPHEN 500 MG PO TABS: 500.0000 mg | ORAL_TABLET | Freq: Four times a day (QID) | ORAL | 0 refills | Status: AC | PRN

## 2019-09-11 NOTE — Discharge Summary (Addendum)
Physician Discharge Summary  Tim Morales WUJ:[id number] DOB: 1965-11-29 DOA: 09/07/2019  PCP: Patient, No Pcp Per  Admit date: 09/07/2019 Discharge date: 09/12/19   Code Status: Full Code  Admitted From: [facility] Discharged to:  [facility]:no  Equipment/Devices:no  Discharge Condition: medically stable for discharge  Recommendations for Outpatient Follow-up   1. Lisinopril discontinued and changed to amlodipine due to aki 2. Abdominal pain likely constipation though improved with antiinflammatory. Discharged with Ibuprofen for the next several days. Try to use tylenol and laxatives first to prevent recurrent AKI on Ibuprofen 3. If persistent pain, would recommend GI input. Referral sent for screening colonoscopy as patient is due  Hospital Summary  This is a 53 year old male with past medical history of depression and suicidal ideation currently at behavioral health, hypertension, chronic back pain, opiate use presented from [facility] for AKI. He also had been having ongoing left lower quadrant abdominal pain x3 days and has had 2 CT scans, one routine with contrast and 1 follow-up CTA, neither of which provided any etiology of ongoing pain. He   AKI likely from decreased PO intake and diarrhea along with contrast from CT scans and lisinopril. Resolved with IV fluids. Abdominal pain likely from constipation as repeat CT without contrast negative again for etiology. Pain improved with toradol and bowel regimen.  Discharged in stable condition    A & P   Principal Problem:   Severe recurrent major depression without psychotic features ([place]) Active Problems:   AKI (acute kidney injury) ([place])   Methamphetamine use ([facility])   AKI, multifactorial: Prerenal and intrarenal etiology dehydration hypotension episode in setting of likely contrast induced nephropathy from to recent contrast studies for abdominal pain evaluation.  Also was on lisinopril.  Creatinine 2.78 on admission, currently 1.2, at  baseline. -follow up outpatient while on NSAIDs -Hold Lisinopril -Encourage PO intake  Severe left lower quadrant abdominal pain of unknown etiology improved and well controlled with Toradol and IV Dilaudid prn.  Initially having nausea and an episode of vomiting and tearful.  Though he does have a history of opiate abuse, he does appear to true pain despite some pain seeking behaviors.  We discussed that he is not likely going to be able to be discharged with opiates and we are going to have to take these off during his hospitalization.  He was okay with this and stated that Toradol helped his pain better than Dilaudid.  Never had a colonoscopy, patient is 21.  Multiple CTs negative.  Had a white count of 14.7 on 12/9 which resolved.  Maybe he had an episode of diverticulitis vs. Severe constipation? Repeat CT negative. -discontinued IV Dilaudid, resolved with Toradol 15 mg IV for severe pain -On Bentyl as needed, Imodium as needed, sucralfate 3 times daily, Zofran as needed, Protonix, Senokot as needed.  Stopped Bentyl, Imodium and Carafate as this is not working and continued home meds. -HCV screen negative (has tattoos from prison, routine screening) -referred to GI at discharged for screening colonoscopy  Normocytic anemia stable. Unknown etiology -follow up outpatient -consider GI consult as above  Possible opioid withdrawal tolerated off opiates -follow up outpatient  Hypertension stable.  Received labetalol x 1 on admission as elevated with elevated HR. Lisinopril held -changed to amlodipine  Suicidal ideation from [facility]. On 1:1 during hospital stay -dc to [facility]    Consultants  . none  Procedures  . none  Antibiotics  none   Subjective  Patient seen and examined at bedside no acute  distress and resting comfortably.  No events overnight.  Tolerating diet. In good spirits and anticipating discharge.   Denies any chest pain, shortness of breath, fever, nausea, vomiting,  urinary or bowel complaints. Otherwise ROS negative   Objective   Discharge Exam: Vitals:   09/11/19 2134 09/12/19 0611  BP: (!) 146/106 (!) 142/97  Pulse: 86 82  Resp: 18 18  Temp: 98.7 F (37.1 C) 98.6 F (37 C)  SpO2: 98% 97%   Vitals:   09/11/19 0603 09/11/19 1347 09/11/19 2134 09/12/19 0611  BP: (!) 138/99 (!) 153/98 (!) 146/106 (!) 142/97  Pulse: 81 86 86 82  Resp: 18 17 18 18   Temp: 97.8 F (36.6 C) 98.6 F (37 C) 98.7 F (37.1 C) 98.6 F (37 C)  TempSrc: Oral  Oral Oral  SpO2: 98% 100% 98% 97%  Weight:      Height:        Physical Exam Vitals and nursing note reviewed.  Constitutional:      Appearance: Normal appearance.  HENT:     Head: Normocephalic and atraumatic.     Nose: Nose normal.     Mouth/Throat:     Mouth: Mucous membranes are moist.  Eyes:     Extraocular Movements: Extraocular movements intact.  Cardiovascular:     Rate and Rhythm: Normal rate and regular rhythm.  Pulmonary:     Effort: Pulmonary effort is normal.     Breath sounds: Normal breath sounds.  Abdominal:     General: Abdomen is flat.     Palpations: Abdomen is soft.  Musculoskeletal:        General: No swelling. Normal range of motion.     Cervical back: Normal range of motion. No rigidity.  Neurological:     General: No focal deficit present.     Mental Status: He is alert. Mental status is at baseline.  Psychiatric:        Mood and Affect: Mood normal.        Behavior: Behavior normal.       The results of significant diagnostics from this hospitalization (including imaging, microbiology, ancillary and laboratory) are listed below for reference.     Microbiology: Recent Results (from the past 240 hour(s))  SARS CORONAVIRUS 2 (TAT 6-24 HRS) Nasopharyngeal Nasopharyngeal Swab     Status: None   Collection Time: 09/08/19  5:44 PM   Specimen: Nasopharyngeal Swab  Result Value Ref Range Status   SARS Coronavirus 2 NEGATIVE NEGATIVE Final    Comment:  (NOTE) SARS-CoV-2 target nucleic acids are NOT DETECTED. The SARS-CoV-2 RNA is generally detectable in upper and lower respiratory specimens during the acute phase of infection. Negative results do not preclude SARS-CoV-2 infection, do not rule out co-infections with other pathogens, and should not be used as the sole basis for treatment or other patient management decisions. Negative results must be combined with clinical observations, patient history, and epidemiological information. The expected result is Negative. Fact Sheet for Patients: HairSlick.nohttps://www.fda.gov/media/138098/download Fact Sheet for Healthcare Providers: quierodirigir.comhttps://www.fda.gov/media/138095/download This test is not yet approved or cleared by the [place] FDA and  has been authorized for detection and/or diagnosis of SARS-CoV-2 by FDA under an Emergency Use Authorization (EUA). This EUA will remain  in effect (meaning this test can be used) for the duration of the COVID-19 declaration under Section 56 4(b)(1) of the Act, 21 U.S.C. section 360bbb-3(b)(1), unless the authorization is terminated or revoked sooner. Performed at [facility], [address] [place],  [place] [address]      Labs: BNP (last 3 results) No results for input(s): BNP in the last 8760 hours. Basic Metabolic Panel: Recent Labs  Lab 09/08/19 0659 09/08/19 1829 09/09/19 0807 09/10/19 0613 09/11/19 0650 09/12/19 0606  NA 137  --  140 138 140 142  K 4.6  --  3.9 3.4* 3.6 3.7  CL 100  --  109 105 105 107  CO2 26  --  24 24 28 26   GLUCOSE 97  --  94 91 100* 96  BUN 31*  --  23* 10 16 18   CREATININE 2.78* 1.95* 1.39* 1.11 1.21 1.20  CALCIUM 9.5  --  8.3* 8.5* 8.8* 8.8*  MG  --   --   --   --  1.9  --    Liver Function Tests: Recent Labs  Lab 09/05/19 1323 09/06/19 1519  AST 12* 13*  ALT 12 13  ALKPHOS 53 57  BILITOT 0.9 1.1  PROT 7.9 8.1  ALBUMIN 4.3 4.4   Recent Labs  Lab 09/05/19 1323 09/06/19 1519  LIPASE 28 24    No results for input(s): AMMONIA in the last 168 hours. CBC: Recent Labs  Lab 09/06/19 1519 09/08/19 1321 09/09/19 0807 09/10/19 0613 09/11/19 0650  WBC 11.2* 7.4 5.9 5.6 5.3  NEUTROABS 8.7* 4.8  --   --   --   HGB 14.7 14.0 11.9* 12.2* 12.3*  HCT 44.1 42.3 36.8* 37.1* 37.7*  MCV 91.1 92.2 94.1 92.8 93.1  PLT 420* 390 290 304 295   Cardiac Enzymes: Recent Labs  Lab 09/06/19 1519 09/08/19 1321  CKTOTAL 28* 63   BNP: Invalid input(s): POCBNP CBG: No results for input(s): GLUCAP in the last 168 hours. D-Dimer No results for input(s): DDIMER in the last 72 hours. Hgb A1c No results for input(s): HGBA1C in the last 72 hours. Lipid Profile No results for input(s): CHOL, HDL, LDLCALC, TRIG, CHOLHDL, LDLDIRECT in the last 72 hours. Thyroid function studies No results for input(s): TSH, T4TOTAL, T3FREE, THYROIDAB in the last 72 hours.  Invalid input(s): FREET3 Anemia work up Recent Labs    09/10/19 0613  FERRITIN 99  TIBC 249*  IRON 104   Urinalysis    Component Value Date/Time   COLORURINE YELLOW 09/10/2019 2240   APPEARANCEUR HAZY (A) 09/10/2019 2240   LABSPEC 1.026 09/10/2019 2240   PHURINE 6.0 09/10/2019 2240   GLUCOSEU >=500 (A) 09/10/2019 2240   HGBUR NEGATIVE 09/10/2019 2240   BILIRUBINUR NEGATIVE 09/10/2019 2240   KETONESUR NEGATIVE 09/10/2019 2240   PROTEINUR 30 (A) 09/10/2019 2240   NITRITE NEGATIVE 09/10/2019 2240   LEUKOCYTESUR NEGATIVE 09/10/2019 2240   Sepsis Labs Invalid input(s): PROCALCITONIN,  WBC,  LACTICIDVEN Microbiology Recent Results (from the past 240 hour(s))  SARS CORONAVIRUS 2 (TAT 6-24 HRS) Nasopharyngeal Nasopharyngeal Swab     Status: None   Collection Time: 09/08/19  5:44 PM   Specimen: Nasopharyngeal Swab  Result Value Ref Range Status   SARS Coronavirus 2 NEGATIVE NEGATIVE Final    Comment: (NOTE) SARS-CoV-2 target nucleic acids are NOT DETECTED. The SARS-CoV-2 RNA is generally detectable in upper and  lower respiratory specimens during the acute phase of infection. Negative results do not preclude SARS-CoV-2 infection, do not rule out co-infections with other pathogens, and should not be used as the sole basis for treatment or other patient management decisions. Negative results must be combined with clinical observations, patient history, and epidemiological information. The expected result is Negative. Fact Sheet for Patients:  HairSlick.no Fact Sheet for Healthcare Providers: quierodirigir.com This test is not yet approved or cleared by the [place] FDA and  has been authorized for detection and/or diagnosis of SARS-CoV-2 by FDA under an Emergency Use Authorization (EUA). This EUA will remain  in effect (meaning this test can be used) for the duration of the COVID-19 declaration under Section 56 4(b)(1) of the Act, 21 U.S.C. section 360bbb-3(b)(1), unless the authorization is terminated or revoked sooner. Performed at [facility], [address], [place], [place] [address]     Discharge Instructions     Discharge Instructions    Ambulatory referral to Gastroenterology   Complete by: As directed    Needs screening colonoscopy   Diet - low sodium heart healthy   Complete by: As directed    Discharge instructions   Complete by: As directed    You were seen and examined in the hospital for abdominal pain and cared for by a hospitalist.   Upon Discharge:  -Take Ibuprofen 400 mg by mouth every 6 hours as needed for abdominal pain over the next several days -Take Senna-Docusate as needed daily for constipation -Stop taking Lisinopril and start taking Amlodipine daily for blood pressure -Take Seroquel 50 mg nightly -Establish care with a GI doctor to get a colonoscopy and if you're still having abdominal pain -Get lab work in the next several days to follow up on your kidney function Make an appointment with  your primary care physician within 7 days Get lab work prior to your follow up appointment with your PCP Bring all home medications to your appointment to review Request that your primary physician go over all hospital tests and procedures/radiological results at the follow up.   Please get all hospital records sent to your physician by signing a hospital release before you go home.     Read the complete instructions along with all the possible side effects for all the medicines you take and that have been prescribed to you. Take any new medicines after you have completely understood and accept all the possible adverse reactions/side effects.   If you have any questions about your discharge medications or the care you received while you were in the hospital, you can call the unit and asked to speak with the hospitalist on call. Once you are discharged, your primary care physician will handle any further medical issues. Please note that NO REFILLS for any discharge medications will be authorized, as it is imperative that you return to your primary care physician (or establish a relationship with a primary care physician if you do not have one) for your aftercare needs so that they can reassess your need for medications and monitor your lab values.   Do not drive, operate heavy machinery, perform activities at heights, swimming or participation in water activities or provide baby sitting services if your were admitted for loss of consciousness/seizures or if you are on sedating medications including, but not limited to benzodiazepines, sleep medications, narcotic pain medications, etc., until you have been cleared to do so by a medical doctor.   Do not take more than prescribed medications.   Wear a seat belt while driving.  If you have smoked or chewed Tobacco in the last 2 years please stop smoking; also stop any regular Alcohol and/or any Recreational drug use including marijuana.  If you  experience worsening of your admission symptoms or develop shortness of breath, chest pain, suicidal or homicidal thoughts or experience a life  threatening emergency, you must seek medical attention immediately by calling 911 or calling your PCP immediately.   Increase activity slowly   Complete by: As directed      Allergies as of 09/12/2019   No Known Allergies     Medication List    STOP taking these medications   lisinopril 10 MG tablet Commonly known as: ZESTRIL     TAKE these medications   acetaminophen 500 MG tablet Commonly known as: TYLENOL Take 1 tablet (500 mg total) by mouth every 6 (six) hours as needed for mild pain.   amLODipine 5 MG tablet Commonly known as: NORVASC Take 1 tablet (5 mg total) by mouth daily.   ibuprofen 400 MG tablet Commonly known as: ADVIL Take 1 tablet (400 mg total) by mouth every 6 (six) hours as needed for moderate pain.   ondansetron 4 MG tablet Commonly known as: ZOFRAN Take 1 tablet (4 mg total) by mouth every 8 (eight) hours as needed for up to 12 doses for nausea or vomiting.   polyethylene glycol 17 g packet Commonly known as: MiraLax Take 17 g by mouth daily.   QUEtiapine 50 MG tablet Commonly known as: SEROQUEL Take 1 tablet (50 mg total) by mouth at bedtime. What changed:   medication strength  how much to take   senna-docusate 8.6-50 MG tablet Commonly known as: Senokot-S Take 2 tablets by mouth daily as needed for mild constipation.       No Known Allergies  Time coordinating discharge: Over 30 minutes   SIGNED:   Burnadette Pop, D.O. [facility] Pager: [phone]  09/12/2019, 11:36 AM

## 2019-09-11 NOTE — Progress Notes (Signed)
PROGRESS NOTE    Tim MarkerBobby Morales    Code Status: Full Code  WUJ:[id number] DOB: [birth year] DOA: 09/07/2019  PCP: Patient, No Pcp Per    Hospital Summary  This is a 53 year old male with past medical history of depression and suicidal ideation currently at behavioral health, hypertension, chronic back pain, opiate use [facility]. He also had been having ongoing left lower quadrant abdominal pain x3 days and has had 2 CT scans, one routine with contrast and 1 follow-up CTA, neither of which provided any etiology of ongoing pain. He   AKI likely from decreased PO intake and diarrhea along with contrast from CT scans and lisinopril. Resolved with IV fluids. Abdominal pain likely from constipation as repeat CT without contrast negative again for etiology. Pain improved with toradol and bowel regimen.   A & P   Active Problems:   Severe recurrent major depression without psychotic features (HCC)   AKI (acute kidney injury) (HCC)  AKI, multifactorial: Prerenal and intrarenal etiologydehydration hypotension episode in setting of likely contrast induced nephropathy from to recent contrast studies for abdominal pain evaluation. Also was on lisinopril. Creatinine 2.78 on admission, at baseline. -follow up while on NSAIDs -Hold Lisinopril -Encourage PO intake  Severe left lower quadrant abdominal painof unknown etiologyimproved and well controlled with Toradol and IV Dilaudid prn. Initially having nausea and an episode of vomiting and tearful.Though he does have a history of opiate abuse, he does appear to true pain despite some pain seeking behaviors. We discussed that he is not likely going to be able to be discharged with opiates and we are going to have to take these off during his hospitalization. He was okay with this and stated that Toradol helped his pain better than Dilaudid. Never had a colonoscopy,patient is 53. Multiple CTsnegative.Had a white count of 14.7 on  12/9 which resolved. Maybe he had an episode of diverticulitis vs. Severe constipation? Repeat CT negative. -discontinued IV Dilaudid, resolved with Toradol15mg  IV for severe pain which was changed to Motrin -Was on Bentyl as needed, Imodium as needed, sucralfate 3 times daily, Zofran as needed, Protonix, Senokot as needed. Stopped Bentyl, Imodium and Carafate as this is not working and continued home meds. -HCV screen negative(has tattoos from prison, routine screening) -referred to GI at discharged for screening colonoscopy  Normocytic anemiastable. Unknown etiology -follow up outpatient -consider GI consult as above  Possible opioid withdrawaltolerated off opiates -follow up outpatient  Hypertensionstable. Received labetalol x 1 on admission as elevated with elevated HR. Lisinopril held -changed to amlodipine  [facility]ationfrom [facility]. On 1:1 during hospital stay -[facility]   DVT prophylaxis: Lovenox Family Communication: No family at bedside Disposition Plan: [facility].  Patient is medically stable for discharge.  Consultants  Psych  Procedures  None  Antibiotics  None      Subjective   Patient seen and examined at bedside in no acute distress and resting comfortably. No acute events overnight. Denies any acute complaints at this time. Ambulating. Tolerating diet well.   Objective   Vitals:   09/10/19 2059 09/11/19 0234 09/11/19 0603 09/11/19 1347  BP: (!) 147/103 (!) 131/93 (!) 138/99 (!) 153/98  Pulse: 85 76 81 86  Resp: 19  18 17   Temp: 98.4 F (36.9 C)  97.8 F (36.6 C) 98.6 F (37 C)  TempSrc: Oral  Oral   SpO2: 98%  98% 100%  Weight:  Height:        Intake/Output Summary (Last 24 hours) at 09/11/2019 1929 Last data filed at 09/11/2019 1349 Gross per 24 hour  Intake 480 ml  Output --  Net 480 ml   Filed Weights   09/08/19 1829  Weight: 80.3 kg     Examination:  Physical Exam Vitals and nursing note reviewed.  Constitutional:      Appearance: Normal appearance.  HENT:     Head: Normocephalic and atraumatic.     Nose: Nose normal.     Mouth/Throat:     Mouth: Mucous membranes are moist.  Eyes:     Extraocular Movements: Extraocular movements intact.  Cardiovascular:     Rate and Rhythm: Normal rate and regular rhythm.  Pulmonary:     Effort: Pulmonary effort is normal.     Breath sounds: Normal breath sounds.  Abdominal:     General: Abdomen is flat.     Palpations: Abdomen is soft.  Musculoskeletal:        General: No swelling. Normal range of motion.     Cervical back: Normal range of motion. No rigidity.  Neurological:     General: No focal deficit present.     Mental Status: He is alert. Mental status is at baseline.  Psychiatric:        Mood and Affect: Mood normal.        Behavior: Behavior normal.     Data Reviewed: I have personally reviewed following labs and imaging studies  CBC: Recent Labs  Lab 09/06/19 1519 09/08/19 1321 09/09/19 0807 09/10/19 0613 09/11/19 0650  WBC 11.2* 7.4 5.9 5.6 5.3  NEUTROABS 8.7* 4.8  --   --   --   HGB 14.7 14.0 11.9* 12.2* 12.3*  HCT 44.1 42.3 36.8* 37.1* 37.7*  MCV 91.1 92.2 94.1 92.8 93.1  PLT 420* 390 290 304 295   Basic Metabolic Panel: Recent Labs  Lab 09/06/19 1519 09/08/19 0659 09/08/19 1829 09/09/19 0807 09/10/19 0613 09/11/19 0650  NA 139 137  --  140 138 140  K 3.8 4.6  --  3.9 3.4* 3.6  CL 102 100  --  109 105 105  CO2 22 26  --  24 24 28   GLUCOSE 104* 97  --  94 91 100*  BUN 24* 31*  --  23* 10 16  CREATININE 1.44* 2.78* 1.95* 1.39* 1.11 1.21  CALCIUM 9.1 9.5  --  8.3* 8.5* 8.8*  MG  --   --   --   --   --  1.9   GFR: Estimated Creatinine Clearance: 71.2 mL/min (by C-G formula based on SCr of 1.21 mg/dL). Liver Function Tests: Recent Labs  Lab 09/05/19 1323 09/06/19 1519  AST 12* 13*  ALT 12 13  ALKPHOS 53 57  BILITOT 0.9 1.1   PROT 7.9 8.1  ALBUMIN 4.3 4.4   Recent Labs  Lab 09/05/19 1323 09/06/19 1519  LIPASE 28 24   No results for input(s): AMMONIA in the last 168 hours. Coagulation Profile: No results for input(s): INR, PROTIME in the last 168 hours. Cardiac Enzymes: Recent Labs  Lab 09/06/19 1519 09/08/19 1321  CKTOTAL 28* 63   BNP (last 3 results) No results for input(s): PROBNP in the last 8760 hours. HbA1C: No results for input(s): HGBA1C in the last 72 hours. CBG: No results for input(s): GLUCAP in the last 168 hours. Lipid Profile: No results for input(s): CHOL, HDL, LDLCALC, TRIG, CHOLHDL, LDLDIRECT in the last 72 hours.  Thyroid Function Tests: No results for input(s): TSH, T4TOTAL, FREET4, T3FREE, THYROIDAB in the last 72 hours. Anemia Panel: Recent Labs    09/10/19 0613  FERRITIN 99  TIBC 249*  IRON 104   Sepsis Labs: Recent Labs  Lab 09/06/19 1520 09/06/19 1744 09/08/19 1321 09/08/19 1829  LATICACIDVEN 1.7 0.9 1.0 1.3    Recent Results (from the past 240 hour(s))  SARS CORONAVIRUS 2 (TAT 6-24 HRS) Nasopharyngeal Nasopharyngeal Swab     Status: None   Collection Time: 09/08/19  5:44 PM   Specimen: Nasopharyngeal Swab  Result Value Ref Range Status   SARS Coronavirus 2 NEGATIVE NEGATIVE Final    Comment: (NOTE) SARS-CoV-2 target nucleic acids are NOT DETECTED. The SARS-CoV-2 RNA is generally detectable in upper and lower respiratory specimens during the acute phase of infection. Negative results do not preclude SARS-CoV-2 infection, do not rule out co-infections with other pathogens, and should not be used as the sole basis for treatment or other patient management decisions. Negative results must be combined with clinical observations, patient history, and epidemiological information. The expected result is Negative. Fact Sheet for Patients: HairSlick.no Fact Sheet for Healthcare  Providers: quierodirigir.com This test is not yet approved or cleared by the [place] FDA and  has been authorized for detection and/or diagnosis of SARS-CoV-2 by FDA under an Emergency Use Authorization (EUA). This EUA will remain  in effect (meaning this test can be used) for the duration of the COVID-19 declaration under Section 56 4(b)(1) of the Act, 21 U.S.C. section 360bbb-3(b)(1), unless the authorization is terminated or revoked sooner. Performed at [facility], [address] [place], [place] [address]          Radiology Studies: CT ABDOMEN PELVIS WO CONTRAST  Result Date: 09/09/2019 CLINICAL DATA:  Left lower quadrant pain EXAM: CT ABDOMEN AND PELVIS WITHOUT CONTRAST TECHNIQUE: Multidetector CT imaging of the abdomen and pelvis was performed following the standard protocol without IV contrast. COMPARISON:  09/06/2019 FINDINGS: Lower chest: Minimal basilar atelectasis is noted. Hepatobiliary: No focal liver abnormality is seen. No gallstones, gallbladder wall thickening, or biliary dilatation. Pancreas: Unremarkable. No pancreatic ductal dilatation or surrounding inflammatory changes. Spleen: Normal in size without focal abnormality. Adrenals/Urinary Tract: Adrenal glands are within normal limits. Kidneys are well visualized bilaterally. No renal calculi or obstructive changes are seen. The bladder is partially distended. Stomach/Bowel: The colon again demonstrates some mild diverticular change without evidence of diverticulitis. The appendix is within normal limits. The small bowel shows no obstructive or inflammatory changes. Stomach is decompressed. Vascular/Lymphatic: Aortic atherosclerosis. No enlarged abdominal or pelvic lymph nodes. Reproductive: Prostate is unremarkable. Other: No abdominal wall hernia or abnormality. No abdominopelvic ascites. Musculoskeletal: No acute bony abnormality is noted. 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 IMPRESSION:  Mild bibasilar atelectasis. Diverticulosis without diverticulitis stable from the prior exam. No acute abnormality is noted to correspond with the patient's given clinical symptomatology. Electronically Signed   By: Alcide Clever M.D.   On: 09/09/2019 21:03        Scheduled Meds: . amLODipine  5 mg Oral Daily  . docusate sodium  100 mg Oral Daily  . enoxaparin (LOVENOX) injection  40 mg Subcutaneous Q24H  . multivitamin with minerals  1 tablet Oral Daily  . pantoprazole  40 mg Oral Daily  . QUEtiapine  50 mg Oral QHS  . thiamine  100 mg Oral Daily   Continuous Infusions:   LOS: 4 days    Time spent: 15 minutes with  over 50% of the time coordinating the patient's care    Harold Hedge, DO [facility] Pager [phone]  If 7PM-7AM, please contact night-coverage [email or web address] Password [facility] 09/11/2019, 7:29 PM

## 2019-09-11 NOTE — TOC Initial Note (Signed)
Transition of Care [facility]) - Initial/Assessment Note    Patient Details  Name: Tim Morales MRN: [id number] Date of Birth: 1966/09/27  [facility]) CM/SW Contact:    Trish Mage, LCSW Phone Number: 09/11/2019, 2:20 PM  Clinical Narrative:   Patient who was sent to use from [facility] for abdominal pain is now medically stable and ready for return.  Dr must put in psych consult order to start process of [facility] re-entry. TOC will continue to follow during the course of hospitalization.]                 Expected Discharge Plan: Psychiatric Hospital Barriers to Discharge: Other (comment)(system red tape)   Patient Goals and CMS Choice        Expected Discharge Plan and Services Expected Discharge Plan: Psychiatric Hospital         Expected Discharge Date: 09/11/19                                    Prior Living Arrangements/Services                       Activities of Daily Living Home Assistive Devices/Equipment: None ADL Screening (condition at time of admission) Patient's cognitive ability adequate to safely complete daily activities?: Yes Is the patient deaf or have difficulty hearing?: No Does the patient have difficulty seeing, even when wearing glasses/contacts?: Yes Does the patient have difficulty concentrating, remembering, or making decisions?: Yes Patient able to express need for assistance with ADLs?: Yes Does the patient have difficulty dressing or bathing?: No Independently performs ADLs?: Yes (appropriate for developmental age) Does the patient have difficulty walking or climbing stairs?: No Weakness of Legs: None Weakness of Arms/Hands: None  Permission Sought/Granted                  Emotional Assessment              Admission diagnosis:  Severe recurrent major depression without psychotic features ([place]) [F33.2] AKI (acute kidney injury) ([place]) [N17.9] Patient Active Problem List   Diagnosis Date Noted  . AKI (acute  kidney injury) ([place]) 09/08/2019  . Severe recurrent major depression without psychotic features ([place]) 09/07/2019  . MDD (major depressive disorder), recurrent episode, severe ([place]) 09/06/2019   PCP:  Patient, No Pcp Per Pharmacy:   [facility] [place], [place] - [address] [place] [address] Phone: [phone] Fax: [phone]     Social Determinants of Health (SDOH) Interventions    Readmission Risk Interventions No flowsheet data found.

## 2019-09-12 ENCOUNTER — Encounter (HOSPITAL_COMMUNITY): Payer: Self-pay | Admitting: Psychiatry

## 2019-09-12 ENCOUNTER — Inpatient Hospital Stay (HOSPITAL_COMMUNITY)
Admission: AD | Admit: 2019-09-12 | Discharge: 2019-09-13 | DRG: 897 | Disposition: A | Discharge status: Home / Self Care | Payer: Medicaid Other | Source: Intra-hospital | Attending: Psychiatry | Admitting: Psychiatry

## 2019-09-12 ENCOUNTER — Encounter (HOSPITAL_COMMUNITY): Payer: Self-pay | Admitting: Internal Medicine

## 2019-09-12 ENCOUNTER — Other Ambulatory Visit: Payer: Self-pay

## 2019-09-12 DIAGNOSIS — R000 Tachycardia, unspecified: Secondary | ICD-10-CM | POA: Diagnosis present

## 2019-09-12 DIAGNOSIS — F1120 Opioid dependence, uncomplicated: Principal | ICD-10-CM | POA: Diagnosis present

## 2019-09-12 DIAGNOSIS — Z79899 Other long term (current) drug therapy: Secondary | ICD-10-CM | POA: Diagnosis not present

## 2019-09-12 DIAGNOSIS — I10 Essential (primary) hypertension: Secondary | ICD-10-CM | POA: Diagnosis present

## 2019-09-12 DIAGNOSIS — F1124 Opioid dependence with opioid-induced mood disorder: Secondary | ICD-10-CM | POA: Diagnosis not present

## 2019-09-12 DIAGNOSIS — F1010 Alcohol abuse, uncomplicated: Secondary | ICD-10-CM | POA: Diagnosis present

## 2019-09-12 DIAGNOSIS — F1510 Other stimulant abuse, uncomplicated: Secondary | ICD-10-CM

## 2019-09-12 LAB — BASIC METABOLIC PANEL
Anion gap: 9 (ref 5–15)
BUN: 18 mg/dL (ref 6–20)
CO2: 26 mmol/L (ref 22–32)
Calcium: 8.8 mg/dL — ABNORMAL LOW (ref 8.9–10.3)
Chloride: 107 mmol/L (ref 98–111)
Creatinine, Ser: 1.2 mg/dL (ref 0.61–1.24)
GFR calc Af Amer: >60 mL/min (ref >60)
GFR calc non Af Amer: >60 mL/min (ref >60)
Glucose, Bld: 96 mg/dL (ref 70–99)
Potassium: 3.7 mmol/L (ref 3.5–5.1)
Sodium: 142 mmol/L (ref 135–145)

## 2019-09-12 MED ORDER — CLONIDINE HCL 0.1 MG PO TABS
0.1000 mg | ORAL_TABLET | Freq: Four times a day (QID) | ORAL | Status: DC
  Administered 2019-09-12 (×2): 0.1 mg via ORAL
  Filled 2019-09-12 (×10): qty 1

## 2019-09-12 MED ORDER — LOPERAMIDE HCL 2 MG PO CAPS: 2.0000 mg | ORAL_CAPSULE | ORAL | Status: DC | PRN

## 2019-09-12 MED ORDER — TRAZODONE HCL 50 MG PO TABS: 50.0000 mg | ORAL_TABLET | Freq: Every evening | ORAL | Status: DC | PRN

## 2019-09-12 MED ORDER — NAPROXEN 500 MG PO TABS
500.0000 mg | ORAL_TABLET | Freq: Two times a day (BID) | ORAL | Status: DC | PRN
  Administered 2019-09-12: 500 mg via ORAL
  Filled 2019-09-12: qty 1

## 2019-09-12 MED ORDER — CHLORDIAZEPOXIDE HCL 25 MG PO CAPS: 25.0000 mg | ORAL_CAPSULE | Freq: Four times a day (QID) | ORAL | Status: DC | PRN

## 2019-09-12 MED ORDER — MAGNESIUM HYDROXIDE 400 MG/5ML PO SUSP
30.0000 mL | Freq: Every day | ORAL | Status: DC | PRN
  Administered 2019-09-12: 30 mL via ORAL

## 2019-09-12 MED ORDER — POLYETHYLENE GLYCOL 3350 17 G PO PACK: 17.0000 g | PACK | Freq: Every day | ORAL | Status: DC

## 2019-09-12 MED ORDER — CLONIDINE HCL 0.1 MG PO TABS
0.1000 mg | ORAL_TABLET | ORAL | Status: DC
  Filled 2019-09-12: qty 1

## 2019-09-12 MED ORDER — SULFAMETHOXAZOLE-TRIMETHOPRIM 400-80 MG PO TABS
1.0000 | ORAL_TABLET | Freq: Two times a day (BID) | ORAL | Status: DC
  Administered 2019-09-12 – 2019-09-13 (×2): 1 via ORAL
  Filled 2019-09-12 (×6): qty 1

## 2019-09-12 MED ORDER — ALUM & MAG HYDROXIDE-SIMETH 200-200-20 MG/5ML PO SUSP: 30.0000 mL | ORAL | Status: DC | PRN

## 2019-09-12 MED ORDER — HYDROXYZINE HCL 25 MG PO TABS: 25.0000 mg | ORAL_TABLET | Freq: Three times a day (TID) | ORAL | Status: DC | PRN

## 2019-09-12 MED ORDER — DICYCLOMINE HCL 20 MG PO TABS
20.0000 mg | ORAL_TABLET | Freq: Four times a day (QID) | ORAL | Status: DC | PRN
  Administered 2019-09-12: 20 mg via ORAL
  Filled 2019-09-12: qty 1

## 2019-09-12 MED ORDER — HYDROXYZINE HCL 25 MG PO TABS
25.0000 mg | ORAL_TABLET | Freq: Four times a day (QID) | ORAL | Status: DC | PRN
  Administered 2019-09-12: 25 mg via ORAL
  Filled 2019-09-12: qty 1

## 2019-09-12 MED ORDER — CLONIDINE HCL 0.1 MG PO TABS: 0.1000 mg | ORAL_TABLET | Freq: Every day | ORAL | Status: DC

## 2019-09-12 MED ORDER — ONDANSETRON 4 MG PO TBDP
4.0000 mg | ORAL_TABLET | Freq: Four times a day (QID) | ORAL | Status: DC | PRN
  Administered 2019-09-12: 4 mg via ORAL
  Filled 2019-09-12: qty 1

## 2019-09-12 MED ORDER — ACETAMINOPHEN 325 MG PO TABS: 650.0000 mg | ORAL_TABLET | Freq: Four times a day (QID) | ORAL | Status: DC | PRN

## 2019-09-12 MED ORDER — POLYETHYLENE GLYCOL 3350 17 G PO PACK: 17.0000 g | PACK | Freq: Every day | ORAL | 0 refills | Status: DC

## 2019-09-12 MED ORDER — METHOCARBAMOL 500 MG PO TABS
500.0000 mg | ORAL_TABLET | Freq: Three times a day (TID) | ORAL | Status: DC | PRN
  Administered 2019-09-12: 500 mg via ORAL
  Filled 2019-09-12: qty 1

## 2019-09-12 MED ORDER — QUETIAPINE FUMARATE 100 MG PO TABS
100.0000 mg | ORAL_TABLET | Freq: Every day | ORAL | Status: DC
  Administered 2019-09-12: 100 mg via ORAL
  Filled 2019-09-12 (×3): qty 1

## 2019-09-12 MED ORDER — SENNOSIDES-DOCUSATE SODIUM 8.6-50 MG PO TABS: 2.0000 | ORAL_TABLET | Freq: Two times a day (BID) | ORAL | Status: DC

## 2019-09-12 MED ORDER — LISINOPRIL 10 MG PO TABS
10.0000 mg | ORAL_TABLET | Freq: Every day | ORAL | Status: DC
  Administered 2019-09-12: 10 mg via ORAL
  Filled 2019-09-12 (×4): qty 1

## 2019-09-12 NOTE — Progress Notes (Signed)
Patient has no access to choice of suicide method while in the hospital.  Patient able to [facility] safety with the support of hospital environment at this time.

## 2019-09-12 NOTE — TOC Progression Note (Addendum)
Transition of Care [facility]) - Progression Note    Patient Details  Name: Tim Morales MRN: [id number] Date of Birth: 1966-04-25  Transition [facility]) CM/SW [place], [place] Phone Number: 09/12/2019, 10:51 AM  Clinical Narrative:   Received message from both Dr Tawanna Solo and Letitia Libra at [facility] asking me to see patient for resources following psych clearance by [facility] earlier this AM.  Upon asking for some background and how I could be helpful, Mr Ullom went into detailed explanation of OD death of his ex of 20 years who is also the mother of his children, how he blames her husband a "Ollen Gross" at the time of not doing more to save her, and how he continues to have thoughts of self harm.  Initially, he stated after further reflection, that he knew he would go to jail if he killed anyone.  However, in the next breathe, he spoke of wanting to "[employer] with a pillow" and then "take sleeping pills in order to go to sleep and not wake up."  He repeated same for Dr as he came into the room at the tail end of our conversation.   I spoke with the Dr of my reluctance to d/c given verbalization of plan, and also intent.   TOC will continue to follow during the course of hospitalization.  Addendum:  Patient accepted for re-admission to [facility]. Room 305-2.  Nursing, please call report to [phone]. CSW will call for SAFE transport when all is ready.     Expected Discharge Plan: Psychiatric Hospital Barriers to Discharge: (Continuing psychiatric evaluation)  Expected Discharge Plan and Services Expected Discharge Plan: [facility]         Expected Discharge Date: 09/11/19                                     Social Determinants of Health (SDOH) Interventions    Readmission Risk Interventions No flowsheet data found.

## 2019-09-12 NOTE — Progress Notes (Signed)
Patient did not attend the evening speaker AA meeting. Pt was notified that group was beginning but returned to his room.   

## 2019-09-12 NOTE — Progress Notes (Signed)
   09/12/19 2325  Psych Admission Type (Psych Patients Only)  Admission Status Voluntary  Psychosocial Assessment  Patient Complaints Depression;Substance abuse  Eye Contact Fair  Facial Expression Flat  Affect Depressed  Speech Logical/coherent  Interaction Minimal  Motor Activity Other (Comment) (wdl)  Appearance/Hygiene Unremarkable  Behavior Characteristics Cooperative  Mood Depressed  Thought Process  Coherency WDL  Content WDL  Delusions None reported or observed  Perception WDL  Hallucination None reported or observed  Judgment Limited  Confusion None  Danger to Self  Current suicidal ideation? Passive  Self-Injurious Behavior Some self-injurious ideation observed or expressed.  No lethal plan expressed   Agreement Not to Harm Self Yes  Description of Agreement  (verbal contract)  Danger to Others  Danger to Others Reported or observed  Danger to Others Abnormal  Harmful Behavior to others No threats or harm toward other people  Destructive Behavior No threats or harm toward property  D: Patient in dayroom reports he is doing okay. Pt attended and engage in evening wrap up group.  A: Medications administered as prescribed. Support and encouragement provided as needed.  R: Patient remains safe on the unit. Will continue to monitor for safety and stability.

## 2019-09-12 NOTE — Consult Note (Addendum)
Telepsych Consultation   Reason for Consult:  "Medically clear, evaluate for [facility]" Referring Physician: Dr Tawanna Solo Location of Patient:  Location of Provider: [facility]  Patient Identification: Tim Morales MRN:  [id number] Principal Diagnosis: Severe recurrent major depression without psychotic features [facility]) Diagnosis:  Principal Problem:   Severe recurrent major depression without psychotic features ([place]) Active Problems:   AKI (acute kidney injury) ([place])   Methamphetamine use ([place])   Total Time spent with patient: 30 minutes  Subjective:   Tim Morales is a 54 y.o. male patient admitted with suicidal ideations.  Patient assessed by nurse practitioner.  Patient states "my girlfriend of 21 years overdosed on pain meds and Xanax 5 months ago, I found her the next morning with my 63 year old son."  Patient states "I got drunk and thought about suicide but I am back together with my ex-wife after 30 years and she helped me."  Patient states "I was coming off meth after a year and I was not sleeping well."  Patient denies history of suicide attempts, patient denies history of self-harm.  Patient currently lives with ex-wife, patient has 2 children 41 year old daughter and 78 year old son.  Patient reports "I feel depressed for the last 5 months."  Patient denies suicidal ideations at this time.  Patient denies access to weapons.  Patient endorses passive HI toward girlfriends husband, patient denies plan or intent to hurt husband.  Patient denies hallucinations.  Patient works as a Curator.  Patient plans to follow-up with outpatient psychiatry as well as substance use resources. Patient seen along with Dr. Dwyane Dee who agrees with plan for discharge to outpatient.  HPI: Patient admitted with suicidal ideations, transferred to medical floor for evaluation.  Medically clear at this time.  Past Psychiatric History: Major depressive disorder, methamphetamine use  Risk to Self:   No Risk to Others:  No Prior Inpatient Therapy:  Yes Prior Outpatient Therapy:  None  Past Medical History:  Past Medical History:  Diagnosis Date  . Back pain   . Bulging lumbar disc   . Hypertension    No past surgical history on file. Family History: No family history on file. Family Psychiatric  History: Unknown Social History:  Social History   Substance and Sexual Activity  Alcohol Use Yes   Comment: Social     Social History   Substance and Sexual Activity  Drug Use Yes  . Types: Methamphetamines    Social History   Socioeconomic History  . Marital status: Single    Spouse name: Not on file  . Number of children: Not on file  . Years of education: Not on file  . Highest education level: Not on file  Occupational History  . Not on file  Tobacco Use  . Smoking status: Never Smoker  . Smokeless tobacco: Never Used  Substance and Sexual Activity  . Alcohol use: Yes    Comment: Social  . Drug use: Yes    Types: Methamphetamines  . Sexual activity: Not on file  Other Topics Concern  . Not on file  Social History Narrative  . Not on file   Social Determinants of Health   Financial Resource Strain:   . Difficulty of Paying Living Expenses: Not on file  Food Insecurity:   . Worried About Charity fundraiser in the Last Year: Not on file  . Ran Out of Food in the Last Year: Not on file  Transportation Needs:   . Lack of Transportation (Medical): Not on file  .  Lack of Transportation (Non-Medical): Not on file  Physical Activity:   . Days of Exercise per Week: Not on file  . Minutes of Exercise per Session: Not on file  Stress:   . Feeling of Stress : Not on file  Social Connections:   . Frequency of Communication with Friends and Family: Not on file  . Frequency of Social Gatherings with Friends and Family: Not on file  . Attends Religious Services: Not on file  . Active Member of Clubs or Organizations: Not on file  . Attends Banker  Meetings: Not on file  . Marital Status: Not on file   Additional Social History:    Allergies:  No Known Allergies  Labs:  Results for orders placed or performed during the hospital encounter of 09/07/19 (from the past 48 hour(s))  Hepatitis c antibody (reflex)     Status: None   Collection Time: 09/10/19 10:16 AM  Result Value Ref Range   HCV Ab 0.2 0.0 - 0.9 s/co ratio    Comment: (NOTE) Performed At: [facility] [address] [place], [place] [id number] Jolene Schimke MD VH:[id number]   HCV Comment:     Status: None   Collection Time: 09/10/19 10:16 AM  Result Value Ref Range   Comment: Comment     Comment: (NOTE) Non reactive HCV antibody screen is consistent with no HCV infection, unless recent infection is suspected or other evidence exists to indicate HCV infection. **Effective October 15, 2019 HCV Ab w/Rflx to Verification **  will be made non-orderable.  [facility] offers order code [id number] HCV Antibody reflex to  NAA. Performed At: [facility] [address] [place], [place] [id number] Jolene Schimke MD UY:[id number]   Urinalysis, Routine w reflex microscopic     Status: Abnormal   Collection Time: 09/10/19 10:40 PM  Result Value Ref Range   Color, Urine YELLOW YELLOW   APPearance HAZY (A) CLEAR   Specific Gravity, Urine 1.026 1.005 - 1.030   pH 6.0 5.0 - 8.0   Glucose, UA >=500 (A) NEGATIVE mg/dL   Hgb urine dipstick NEGATIVE NEGATIVE   Bilirubin Urine NEGATIVE NEGATIVE   Ketones, ur NEGATIVE NEGATIVE mg/dL   Protein, ur 30 (A) NEGATIVE mg/dL   Nitrite NEGATIVE NEGATIVE   Leukocytes,Ua NEGATIVE NEGATIVE   RBC / HPF 11-20 0 - 5 RBC/hpf   WBC, UA 11-20 0 - 5 WBC/hpf   Bacteria, UA NONE SEEN NONE SEEN   Mucus PRESENT    Hyaline Casts, UA PRESENT    Sperm, UA PRESENT     Comment: Performed at [facility], [address], [place], [place] [address]  AM BMP     Status: Abnormal   Collection Time: 09/11/19  6:50 AM   Result Value Ref Range   Sodium 140 135 - 145 mmol/L   Potassium 3.6 3.5 - 5.1 mmol/L   Chloride 105 98 - 111 mmol/L   CO2 28 22 - 32 mmol/L   Glucose, Bld 100 (H) 70 - 99 mg/dL   BUN 16 6 - 20 mg/dL   Creatinine, Ser 6.43 0.61 - 1.24 mg/dL   Calcium 8.8 (L) 8.9 - 10.3 mg/dL   GFR calc non Af Amer >60 >60 mL/min   GFR calc Af Amer >60 >60 mL/min   Anion gap 7 5 - 15    Comment: Performed at [facility], [address], [place], [place] [address]  AM CBC     Status: Abnormal  Collection Time: 09/11/19  6:50 AM  Result Value Ref Range   WBC 5.3 4.0 - 10.5 K/uL   RBC 4.05 (L) 4.22 - 5.81 MIL/uL   Hemoglobin 12.3 (L) 13.0 - 17.0 g/dL   HCT 40.937.7 (L) 81.139.0 - 91.452.0 %   MCV 93.1 80.0 - 100.0 fL   MCH 30.4 26.0 - 34.0 pg   MCHC 32.6 30.0 - 36.0 g/dL   RDW 78.213.1 95.611.5 - 21.315.5 %   Platelets 295 150 - 400 K/uL   nRBC 0.0 0.0 - 0.2 %    Comment: Performed at [facility], [address] [place], [place] [address]  AM Mg     Status: None   Collection Time: 09/11/19  6:50 AM  Result Value Ref Range   Magnesium 1.9 1.7 - 2.4 mg/dL    Comment: Performed at [facility], [address] [place], [place] [address]  AM BMP     Status: Abnormal   Collection Time: 09/12/19  6:06 AM  Result Value Ref Range   Sodium 142 135 - 145 mmol/L   Potassium 3.7 3.5 - 5.1 mmol/L   Chloride 107 98 - 111 mmol/L   CO2 26 22 - 32 mmol/L   Glucose, Bld 96 70 - 99 mg/dL   BUN 18 6 - 20 mg/dL   Creatinine, Ser 2.951.20 0.61 - 1.24 mg/dL   Calcium 8.8 (L) 8.9 - 10.3 mg/dL   GFR calc non Af Amer >60 >60 mL/min   GFR calc Af Amer >60 >60 mL/min   Anion gap 9 5 - 15    Comment: Performed at [facility], [address] [place], [place] [address]    Medications:  Current Facility-Administered Medications  Medication Dose Route Frequency Provider Last Rate Last Admin  . acetaminophen (TYLENOL) tablet 650 mg  650 mg Oral Q6H PRN Clydia Llanohandra,  Aravind, MD       Or  . acetaminophen (TYLENOL) suppository 650 mg  650 mg Rectal Q6H PRN Clydia Llanohandra, Aravind, MD      . amLODipine (NORVASC) tablet 5 mg  5 mg Oral Daily Jae DireSegal, Jared E, MD   5 mg at 09/11/19 1109  . cyclobenzaprine (FLEXERIL) tablet 5 mg  5 mg Oral TID PRN Jae DireSegal, Jared E, MD      . docusate sodium (COLACE) capsule 100 mg  100 mg Oral Daily Jae DireSegal, Jared E, MD   100 mg at 09/11/19 1246  . enoxaparin (LOVENOX) injection 40 mg  40 mg Subcutaneous Q24H Jae DireSegal, Jared E, MD   40 mg at 09/11/19 2115  . HYDROcodone-acetaminophen (NORCO/VICODIN) 5-325 MG per tablet 1-2 tablet  1-2 tablet Oral Q4H PRN Jae DireSegal, Jared E, MD   2 tablet at 09/11/19 2115  . hydrOXYzine (ATARAX/VISTARIL) tablet 25 mg  25 mg Oral TID PRN Jackelyn PolingBerry, Jason A, NP   25 mg at 09/11/19 2135  . ibuprofen (ADVIL) tablet 400 mg  400 mg Oral Q6H PRN Jae DireSegal, Jared E, MD      . multivitamin with minerals tablet 1 tablet  1 tablet Oral Daily Cobos, Rockey SituFernando A, MD   1 tablet at 09/11/19 1109  . ondansetron (ZOFRAN) injection 4 mg  4 mg Intravenous Q6H PRN Jae DireSegal, Jared E, MD   4 mg at 09/11/19 1007  . pantoprazole (PROTONIX) EC tablet 40 mg  40 mg Oral Daily Clydia Llanohandra, Aravind, MD   40 mg at 09/11/19 1109  . QUEtiapine (SEROQUEL) tablet 50 mg  50 mg Oral QHS Dairl PonderSegal,  Roe Rutherford, MD   50 mg at 09/11/19 2115  . senna-docusate (Senokot-S) tablet 1 tablet  1 tablet Oral QHS PRN Clydia Llano, MD   1 tablet at 09/11/19 2135  . thiamine (VITAMIN B-1) tablet 100 mg  100 mg Oral Daily Cobos, Rockey Situ, MD   100 mg at 09/11/19 1109    Musculoskeletal: Strength & Muscle Tone: within normal limits Gait & Station: normal Patient leans: N/A  Psychiatric Specialty Exam: Physical Exam  Nursing note and vitals reviewed. Constitutional: He is oriented to person, place, and time. He appears well-developed.  HENT:  Head: Normocephalic.  Cardiovascular: Normal rate.  Respiratory: Effort normal.  Neurological: He is alert and oriented to person,  place, and time.  Psychiatric: He has a normal mood and affect. His behavior is normal. Judgment and thought content normal.    Review of Systems  Constitutional: Negative.   HENT: Negative.   Eyes: Negative.   Respiratory: Negative.   Cardiovascular: Negative.   Gastrointestinal: Negative.   Genitourinary: Negative.   Musculoskeletal: Negative.   Skin: Negative.   Neurological: Negative.     Blood pressure (!) 142/97, pulse 82, temperature 98.6 F (37 C), temperature source Oral, resp. rate 18, height 5\' 10"  (1.778 m), weight 80.3 kg, SpO2 97 %.Body mass index is 25.4 kg/m.  General Appearance: Casual  Eye Contact:  Good  Speech:  Clear and Coherent and Normal Rate  Volume:  Normal  Mood:  Depressed  Affect:  Congruent and Depressed  Thought Process:  Coherent, Goal Directed and Descriptions of Associations: Intact  Orientation:  Full (Time, Place, and Person)  Thought Content:  WDL and Logical  Suicidal Thoughts:  No  Homicidal Thoughts:  No  Memory:  Immediate;   Good Recent;   Good Remote;   Good  Judgement:  Fair  Insight:  Good  Psychomotor Activity:  Normal  Concentration:  Concentration: Good and Attention Span: Good  Recall:  Good  Fund of Knowledge:  Good  Language:  Good  Akathisia:  No  Handed:  Right  AIMS (if indicated):     Assets:  Communication Skills Desire for Improvement Financial Resources/Insurance Housing Intimacy Physical Health Social Support Transportation Vocational/Educational  ADL's:  Intact  Cognition:  WNL  Sleep:  Number of Hours: 6.5     Treatment Plan Summary: Consider Prazosin 1mg  PO QHS.  Plan Discharge with outpatient psychiatry and substance use resources.  Disposition: No evidence of imminent risk to self or others at present.   Patient does not meet criteria for psychiatric inpatient admission. Supportive therapy provided about ongoing stressors. Discussed crisis plan, support from social network, calling 911,  coming to the Emergency Department, and calling Suicide Hotline.   Update 1130: Patient endorses suicidal and homicidal ideations to medical floor staff. Patient accepted to [facility] 305-2.  This service was provided via telemedicine using a 2-way, interactive audio and video technology.  Names of all persons participating in this telemedicine service and their role in this encounter. Name: Role: Patient  Name: [facility] Role: FNP    Suanne Marker, FNP 09/12/2019 9:49 AM

## 2019-09-12 NOTE — Progress Notes (Signed)
PROGRESS NOTE    Tim Morales  XTG:[id number] DOB: Oct 07, 1965 DOA: 09/07/2019 PCP: Patient, No Pcp Per   Brief Narrative: Patient however male with history of depression, suicidal ideation.  Behavioral health, hypertension, chronic back pain, opiate abuse who was sent from behavioral health Hospital for evaluation of AKI.  He was also complaining of ongoing lower quadrant abdominal pain.  AKI  has resolved with IV fluids now.  Currently he is hemodynamically stable.  Behavioral health cleared him for discharge to home but during my evaluation this morning, he is still suicidal and homicidal.  I have requested behavioral health to reassess him.  Assessment & Plan:   Principal Problem:   Severe recurrent major depression without psychotic features (H[facility]) Active Problems:   AKI (acute kidney injury) ([facility])   Methamphetamine use ([facility])   AKI: Most likely from dehydration, hypotension episode, contrast-induced nephropathy.  Also on lisinopril, NSAIDs. Creatinine was 2.7 at admission.  AKI  has resolved with IV fluids.  Currently kidney function at baseline.  Lisinopril held.  Left lower quadrant abdominal pain: Currently pain-free.  His abdomen is soft and nontender today.  Constipation issues. No Bowel movement for last 3 days.  Continue bowel regimen.  Multiple CTs have been negative.  He was discontinued.  Normocytic anemia: Currently stable.  Hypertension: Current  blood pressure is  stable.  Continue to monitor blood pressure  Suicidal/homicidal ideation: Patient stated he still feels depressed, suicidal ideations by taking sleeping pills.  Also has homicidal thoughts against his previous  wife's husband.  Initially presented from behavioral health Hospital.  We have  requested evaluation by behavioral health .Continue sitter          DVT prophylaxis: Lovenox Code Status: Full Family Communication: None at bedside Disposition Plan: Home versus behavioral health Hospital.  Waiting  for behavioral health reevaluation   Consultants: BH  Procedures:None  Antimicrobials:  Anti-infectives (From admission, onward)   None      Subjective: Patient seen and examined at bedside this morning.  Hemodynamically stable.  Complains of constipation.  No bowel movement since last 3 days.  States his thoughts have not improved and he feels suicidal.  Objective: Vitals:   09/11/19 0603 09/11/19 1347 09/11/19 2134 09/12/19 0611  BP: (!) 138/99 (!) 153/98 (!) 146/106 (!) 142/97  Pulse: 81 86 86 82  Resp: 18 17 18 18   Temp: 97.8 F (36.6 C) 98.6 F (37 C) 98.7 F (37.1 C) 98.6 F (37 C)  TempSrc: Oral  Oral Oral  SpO2: 98% 100% 98% 97%  Weight:      Height:        Intake/Output Summary (Last 24 hours) at 09/12/2019 1105 Last data filed at 09/11/2019 2135 Gross per 24 hour  Intake 480 ml  Output --  Net 480 ml   Filed Weights   09/08/19 1829  Weight: 80.3 kg    Examination:  General exam: Not in distress,average built HEENT:PERRL,Oral mucosa moist, Ear/Nose normal on gross exam Respiratory system: Bilateral equal air entry, normal vesicular breath sounds, no wheezes or crackles  Cardiovascular system: S1 & S2 heard, RRR. No JVD, murmurs, rubs, gallops or clicks. No pedal edema. Gastrointestinal system: Abdomen is nondistended, soft and nontender. No organomegaly or masses felt. Normal bowel sounds heard. Central nervous system: Alert and oriented. No focal neurological deficits. Extremities: No edema, no clubbing ,no cyanosis Skin: No rashes, lesions or ulcers,no icterus ,no pallorr Psychiatry: Suicidal thoughts    Data Reviewed: I have personally reviewed following labs  and imaging studies  CBC: Recent Labs  Lab 09/06/19 1519 09/08/19 1321 09/09/19 0807 09/10/19 0613 09/11/19 0650  WBC 11.2* 7.4 5.9 5.6 5.3  NEUTROABS 8.7* 4.8  --   --   --   HGB 14.7 14.0 11.9* 12.2* 12.3*  HCT 44.1 42.3 36.8* 37.1* 37.7*  MCV 91.1 92.2 94.1 92.8 93.1  PLT  420* 390 290 304 295   Basic Metabolic Panel: Recent Labs  Lab 09/08/19 0659 09/08/19 1829 09/09/19 0807 09/10/19 0613 09/11/19 0650 09/12/19 0606  NA 137  --  140 138 140 142  K 4.6  --  3.9 3.4* 3.6 3.7  CL 100  --  109 105 105 107  CO2 26  --  24 24 28 26   GLUCOSE 97  --  94 91 100* 96  BUN 31*  --  23* 10 16 18   CREATININE 2.78* 1.95* 1.39* 1.11 1.21 1.20  CALCIUM 9.5  --  8.3* 8.5* 8.8* 8.8*  MG  --   --   --   --  1.9  --    GFR: Estimated Creatinine Clearance: 71.8 mL/min (by C-G formula based on SCr of 1.2 mg/dL). Liver Function Tests: Recent Labs  Lab 09/05/19 1323 09/06/19 1519  AST 12* 13*  ALT 12 13  ALKPHOS 53 57  BILITOT 0.9 1.1  PROT 7.9 8.1  ALBUMIN 4.3 4.4   Recent Labs  Lab 09/05/19 1323 09/06/19 1519  LIPASE 28 24   No results for input(s): AMMONIA in the last 168 hours. Coagulation Profile: No results for input(s): INR, PROTIME in the last 168 hours. Cardiac Enzymes: Recent Labs  Lab 09/06/19 1519 09/08/19 1321  CKTOTAL 28* 63   BNP (last 3 results) No results for input(s): PROBNP in the last 8760 hours. HbA1C: No results for input(s): HGBA1C in the last 72 hours. CBG: No results for input(s): GLUCAP in the last 168 hours. Lipid Profile: No results for input(s): CHOL, HDL, LDLCALC, TRIG, CHOLHDL, LDLDIRECT in the last 72 hours. Thyroid Function Tests: No results for input(s): TSH, T4TOTAL, FREET4, T3FREE, THYROIDAB in the last 72 hours. Anemia Panel: Recent Labs    09/10/19 0613  FERRITIN 99  TIBC 249*  IRON 104   Sepsis Labs: Recent Labs  Lab 09/06/19 1520 09/06/19 1744 09/08/19 1321 09/08/19 1829  LATICACIDVEN 1.7 0.9 1.0 1.3    Recent Results (from the past 240 hour(s))  SARS CORONAVIRUS 2 (TAT 6-24 HRS) Nasopharyngeal Nasopharyngeal Swab     Status: None   Collection Time: 09/08/19  5:44 PM   Specimen: Nasopharyngeal Swab  Result Value Ref Range Status   SARS Coronavirus 2 NEGATIVE NEGATIVE Final    Comment:  (NOTE) SARS-CoV-2 target nucleic acids are NOT DETECTED. The SARS-CoV-2 RNA is generally detectable in upper and lower respiratory specimens during the acute phase of infection. Negative results do not preclude SARS-CoV-2 infection, do not rule out co-infections with other pathogens, and should not be used as the sole basis for treatment or other patient management decisions. Negative results must be combined with clinical observations, patient history, and epidemiological information. The expected result is Negative. Fact Sheet for Patients: HairSlick.nohttps://www.fda.gov/media/138098/download Fact Sheet for Healthcare Providers: quierodirigir.comhttps://www.fda.gov/media/138095/download This test is not yet approved or cleared by the [place] FDA and  has been authorized for detection and/or diagnosis of SARS-CoV-2 by FDA under an Emergency Use Authorization (EUA). This EUA will remain  in effect (meaning this test can be used) for the duration of the COVID-19 declaration under Section  56 4(b)(1) of the Act, 21 U.S.C. section 360bbb-3(b)(1), unless the authorization is terminated or revoked sooner. Performed at [facility], [place] [address] [place], [place] [address]          Radiology Studies: No results found.      Scheduled Meds: . amLODipine  5 mg Oral Daily  . docusate sodium  100 mg Oral Daily  . enoxaparin (LOVENOX) injection  40 mg Subcutaneous Q24H  . multivitamin with minerals  1 tablet Oral Daily  . pantoprazole  40 mg Oral Daily  . polyethylene glycol  17 g Oral Daily  . QUEtiapine  50 mg Oral QHS  . senna-docusate  2 tablet Oral BID  . thiamine  100 mg Oral Daily   Continuous Infusions:   LOS: 5 days    Time spent: 25 mins.More than 50% of that time was spent in counseling and/or coordination of care.      Shelly Coss, MD [facility] Pager [phone]  If 7PM-7AM, please contact night-coverage [email or web address] Password [email or web address] 09/12/2019, 11:05 AM

## 2019-09-12 NOTE — Progress Notes (Signed)
Patient ID: Tim Morales, male   DOB: [birth year], 53 y.o.   MRN: [id number] Patient admitted to the unit due to increased SI and depression.  Patient reported finding his girlfriend deceased from overdose 4 months ago.  Patient states that he wanted to join her and started to have thoughts of suicide.  Patient reports currently living with his ex-wife who he has rekindled a romantic relationship with and she is currently a great support to him.    Skin assessment complete patient found to be free of all injury and contraband. Patient admitted to the unit without incident.

## 2019-09-12 NOTE — Progress Notes (Signed)
Report called to nurse, Rise Paganini at [facility]  Social work Geneticist, molecular.  Two bags of belongings being sent with patient (consolidated from 4 original).  These bags contain patient's clothes, medications, cell phone, etc.  Patient transported by safe transport.  Virginia Rochester, RN

## 2019-09-13 ENCOUNTER — Encounter: Payer: Self-pay | Admitting: *Deleted

## 2019-09-13 DIAGNOSIS — F1124 Opioid dependence with opioid-induced mood disorder: Secondary | ICD-10-CM | POA: Diagnosis not present

## 2019-09-13 MED ORDER — LISINOPRIL 10 MG PO TABS: 10.0000 mg | ORAL_TABLET | Freq: Every day | ORAL | 0 refills | Status: DC

## 2019-09-13 MED ORDER — SULFAMETHOXAZOLE-TRIMETHOPRIM 400-80 MG PO TABS: 1.0000 | ORAL_TABLET | Freq: Two times a day (BID) | ORAL | 0 refills | Status: DC

## 2019-09-13 NOTE — Progress Notes (Signed)
  [facility] Case Management Discharge Plan :  Will you be returning to the same living situation after discharge:  No. Patient reports he is discharging home with his girlfriend. States they have an apartment in [place], [place] At discharge, do you have transportation home?: Yes,  patient's girlfriend picked him up Do you have the ability to pay for your medications: Yes,  Medicaid  Release of information consent forms completed and in the chart;  Patient's signature needed at discharge.  Patient to Follow up at: Follow-up Information    PATIENT DECLINED ANY OUTPATIENT REFERRALS Follow up.   Why: PATIENT DECLINED ANY OUTPATIENT REFERRALS Contact information: PATIENT DECLINED ANY OUTPATIENT REFERRALS          Next level of care provider has access to [place] and Suicide Prevention discussed: Yes,  with the patient  Have you used any form of tobacco in the last 30 days? (Cigarettes, Smokeless Tobacco, Cigars, and/or Pipes): No  Has patient been referred to the Quitline?: N/A patient is not a smoker  Patient has been referred for addiction treatment: [place], [place] 09/13/2019, 2:02 PM

## 2019-09-13 NOTE — Plan of Care (Signed)
Discharge note  Patient verbalizes readiness for discharge. Follow up plan explained, AVS, Transition record and SRA given. Prescriptions and teaching provided. Belongings returned and signed for. Suicide safety plan completed and signed. Patient verbalizes understanding. Patient denies SI/HI and assures this Probation officer he will seek assistance should that change. Patient discharged to lobby where ex-wife was waiting.  Problem: Education: Goal: Knowledge of [facility] Education information/materials will improve Outcome: Adequate for Discharge Goal: Emotional status will improve Outcome: Adequate for Discharge Goal: Mental status will improve Outcome: Adequate for Discharge Goal: Verbalization of understanding the information provided will improve Outcome: Adequate for Discharge   Problem: Activity: Goal: Interest or engagement in activities will improve Outcome: Adequate for Discharge Goal: Sleeping patterns will improve Outcome: Adequate for Discharge   Problem: Coping: Goal: Ability to verbalize frustrations and anger appropriately will improve Outcome: Adequate for Discharge Goal: Ability to demonstrate self-control will improve Outcome: Adequate for Discharge   Problem: Health Behavior/Discharge Planning: Goal: Identification of resources available to assist in meeting health care needs will improve Outcome: Adequate for Discharge Goal: Compliance with treatment plan for underlying cause of condition will improve Outcome: Adequate for Discharge   Problem: Physical Regulation: Goal: Ability to maintain clinical measurements within normal limits will improve Outcome: Adequate for Discharge   Problem: Safety: Goal: Periods of time without injury will increase Outcome: Adequate for Discharge

## 2019-09-13 NOTE — BHH Counselor (Signed)
Adult Comprehensive Assessment  Patient ID: Tim Morales, male   DOB: May 17, 1966, 53 y.o.   MRN: [id number]  Patient is being discharged by attending psychiatrist in less than 24 hours of admission due to not meeting criteria for inpatient treatment at this time. Per attending, the patient is presenting for reasons of secondary gain. Patient declined any outpatient referrals or resources. CSW will continue to follow for an appropriate discharge.     Summary/Recommendations:   Summary and Recommendations (to be completed by the evaluator): Patient was admitted on 09/12/19 and is scheduled to discharge, 09/13/19. Patient has discharged in less than 24 hours and full psycho-social assessment could not be completed. [name] is a 53 year old male who is diagnosed with Opiate dependence. He presented to the hospital seeking treatment for medical needs and upon discharge, he stated he was having suicidal ideation. After being evalauted by attending psychiatrist, Tahmid was scheduled for discahrge. [name] reports he plans to discharge home with his current girlfriend and that they have an apartment in [place], [place]. [name] declined any outpatient referrals and states he will follow up with mental health providers in [place], [place] "once I get established there". CSW offered to make referrals in respective area, however patient continued to decline and states he will follow up on his own. [name] can benefit from crisis stabilization, medication management, therapeutic milieu, group therapy, psycho-education and referral services.  Tim Morales. 09/13/2019

## 2019-09-13 NOTE — BHH Suicide Risk Assessment (Signed)
[facility] Discharge Suicide Risk Assessment   Principal Problem: <principal problem not specified> Discharge Diagnoses: Active Problems:   Opiate dependence (HCC)   Total Time spent with patient: 20 minutes  Musculoskeletal: Strength & Muscle Tone: within normal limits Gait & Station: normal Patient leans: N/A  Psychiatric Specialty Exam: Review of Systems  Gastrointestinal: Positive for abdominal pain.  Musculoskeletal: Positive for arthralgias.  All other systems reviewed and are negative.   Blood pressure 108/84, pulse (!) 113.There is no height or weight on file to calculate BMI.  General Appearance: Disheveled  Eye Contact::  Minimal  Speech:  Normal Rate409  Volume:  Decreased  Mood:  Sedated  Affect:  Congruent  Thought Process:  Coherent and Descriptions of Associations: Circumstantial  Orientation:  Full (Time, Place, and Person)  Thought Content:  Logical  Suicidal Thoughts:  Yes.  without intent/plan  Homicidal Thoughts:  Yes.  without intent/plan  Memory:  Immediate;   Poor Recent;   Poor Remote;   Poor  Judgement:  Impaired  Insight:  Lacking  Psychomotor Activity:  Normal  Concentration:  Fair  Recall:  [employer] of Knowledge:Fair  Language: Good  Akathisia:  Negative  Handed:  Right  AIMS (if indicated):     Assets:  Desire for Improvement Resilience  Sleep:  Number of Hours: 5.25  Cognition: WNL  ADL's:  Intact   Mental Status Per Nursing Assessment::   On Admission:  Suicidal ideation indicated by patient, Thoughts of violence towards others  Demographic Factors:  Male, Caucasian and Unemployed  Loss Factors: Financial problems/change in socioeconomic status  Historical Factors: Impulsivity  Risk Reduction Factors:   NA  Continued Clinical Symptoms:  Alcohol/Substance Abuse/Dependencies  Cognitive Features That Contribute To Risk:  Thought constriction (tunnel vision)    Suicide Risk:  Minimal: No identifiable suicidal ideation.   Patients presenting with no risk factors but with morbid ruminations; may be classified as minimal risk based on the severity of the depressive symptoms    Plan Of Care/Follow-up recommendations:  Activity:  ad lib  Sharma Covert, MD 09/13/2019, 9:32 AM

## 2019-09-13 NOTE — BHH Suicide Risk Assessment (Signed)
[facility] INPATIENT:  Family/Significant Other Suicide Prevention Education  Suicide Prevention Education:  Patient Refusal for Family/Significant Other Suicide Prevention Education: The patient Tim Morales has refused to provide written consent for family/significant other to be provided Family/Significant Other Suicide Prevention Education during admission and/or prior to discharge.  Physician notified.  SPE completed with patient, as patient refused to consent to family contact. SPI pamphlet provided to pt and pt was encouraged to share information with support network, ask questions, and talk about any concerns relating to SPE. Patient denies access to guns/firearms and verbalized understanding of information provided. Mobile Crisis information also provided to patient.    [name] 09/13/2019, 2:03 PM

## 2019-09-13 NOTE — Discharge Summary (Signed)
  Patient is seen and examined.  Please see admission note from 09/13/2019.  This is an admission/discharge on the same date.  It is felt that the patient is using his suicidal and homicidal ideation and attempt to gain housing.  It is felt that he is using this for secondary gain.  His vital signs are stable.  He does not appear to be in any form of active withdrawal.  He will be discharged home today.

## 2019-09-13 NOTE — H&P (Signed)
Psychiatric Admission Assessment Adult  Patient Identification: Tim Morales MRN:  [id number] Date of Evaluation:  09/13/2019 Chief Complaint:  Opiate dependence (HCC) [F11.20] Principal Diagnosis: <principal problem not specified> Diagnosis:  Active Problems:   Opiate dependence (HCC)  History of Present Illness: Patient is seen and examined.  Patient is a 53 year old male with a past psychiatric history significant for opiate dependence as well as other substance use disorders who presented to the [facility] emergency department on 09/05/2019 with nausea and diarrhea.  The patient stated that that time that he was concerned for diverticulitis.  He was scheduled for discharge.  He then mentions suicidal thinking to the emergency room physician.  He stated that he had been experiencing suicidal ideation as well as homicidal ideation.  He stated my girlfriend overdose 4 months ago, I cannot sleep".  He also stated that he had been drinking beer approximately 7 beers a week.  A bed became available and he was transferred to the behavioral health hospital on 12/16.  This morning he stated that his girlfriend died 4 to 5 months ago from an overdose.  He stated he was sad about that.  He stated he is now living with his new girlfriend.  He stated that his new girlfriend is about to divorce her husband, and they are planning on moving to [place].  When we discussed discharge she then casually stated "I am going to go hurt other people", then he stated "I am going to hurt myself".  During this time.  He was just laying in bed casually.  When I told the patient that I would be discharging him today he stated "I will just go back to the emergency room again".  He stated he received opiates in the emergency department, and prior to that he had not had any opiates in 7 days.  He stated that his alcohol intake was only 7 beers a week.  Review of his laboratories revealed essentially normal  electrolytes except for a mildly elevated creatinine at 1.2.  He does have abnormal lipids with triglycerides of 155.  He is iron deficient and anemic.  He does have a urinary tract infection, and his drug screen was positive for opiates.  His CT scan of his abdomen revealed diverticular changes without evidence of diverticulosis.  He has some chronic musculoskeletal abnormalities as well.  It is felt that his comments for suicidality and homicidality are being manipulative.  I explained to the patient that with what he had discussed with his physicians, counselors and myself that he did not require psychiatric hospitalization.  Vital signs are stable, he has a mild tachycardia with a rate of 105-113.  His EKG showed a normal sinus rhythm with a normal QTC. Associated Signs/Symptoms: Depression Symptoms:  anhedonia, suicidal thoughts without plan, loss of energy/fatigue, (Hypo) Manic Symptoms:  Impulsivity, Irritable Mood, Anxiety Symptoms:  Excessive Worry, Psychotic Symptoms:  Hallucinations: Auditory Visual PTSD Symptoms: Negative Total Time spent with patient: 30 minutes  Past Psychiatric History: Patient has had multiple emergency room visits to our facility as well as [facility].  He had presented on 09/06/19 to the emergency department on 12/10.  Admission was originally scheduled, and then he was discharged from the emergency room.  He then represented on 12/11 and 12/12.  He has a longstanding history of polysubstance dependence.  Does appear that he received Seroquel 100 mg p.o. nightly for sleep issues when he was seen on 12/11.  Is the patient at risk  to self? No.  Has the patient been a risk to self in the past 6 months? No.  Has the patient been a risk to self within the distant past? No.  Is the patient a risk to others? No.  Has the patient been a risk to others in the past 6 months? No.  Has the patient been a risk to others within the distant past? No.   Prior Inpatient  Therapy:   Prior Outpatient Therapy:    Alcohol Screening: Patient refused Alcohol Screening Tool: Yes 1. How often do you have a drink containing alcohol?: 4 or more times a week 2. How many drinks containing alcohol do you have on a typical day when you are drinking?: 5 or 6 3. How often do you have six or more drinks on one occasion?: Daily or almost daily AUDIT-C Score: 10 4. How often during the last year have you found that you were not able to stop drinking once you had started?: Daily or almost daily 5. How often during the last year have you failed to do what was normally expected from you becasue of drinking?: Daily or almost daily 6. How often during the last year have you needed a first drink in the morning to get yourself going after a heavy drinking session?: Daily or almost daily 7. How often during the last year have you had a feeling of guilt of remorse after drinking?: Weekly 8. How often during the last year have you been unable to remember what happened the night before because you had been drinking?: Weekly 9. Have you or someone else been injured as a result of your drinking?: Yes, but not in the last year 10. Has a relative or friend or a doctor or another health worker been concerned about your drinking or suggested you cut down?: Yes, but not in the last year Alcohol Use Disorder Identification Test Final Score (AUDIT): 32 Alcohol Brief Interventions/Follow-up: Alcohol Education Substance Abuse History in the last 12 months:  Yes.   Consequences of Substance Abuse: Negative Previous Psychotropic Medications: Yes  Psychological Evaluations: Yes  Past Medical History:  Past Medical History:  Diagnosis Date  . Back pain   . Bulging lumbar disc   . Hypertension    History reviewed. No pertinent surgical history. Family History: History reviewed. No pertinent family history. Family Psychiatric  History: Noncontributory Tobacco Screening: Have you used any form of  tobacco in the last 30 days? (Cigarettes, Smokeless Tobacco, Cigars, and/or Pipes): No Social History:  Social History   Substance and Sexual Activity  Alcohol Use Yes   Comment: Social     Social History   Substance and Sexual Activity  Drug Use Yes  . Types: Methamphetamines    Additional Social History:                           Allergies:  No Known Allergies Lab Results: No results found for this or any previous visit (from the past 48 hour(s)).  Blood Alcohol level:  Lab Results  Component Value Date   ETH <10 09/05/2019   ETH <10 04/10/2019    Metabolic Disorder Labs:  Lab Results  Component Value Date   HGBA1C 5.3 09/08/2019   MPG 105.41 09/08/2019   No results found for: PROLACTIN Lab Results  Component Value Date   CHOL 203 (H) 09/08/2019   TRIG 153 (H) 09/08/2019   HDL 28 (L) 09/08/2019   CHOLHDL  7.3 09/08/2019   VLDL 31 09/08/2019   LDLCALC 144 (H) 09/08/2019    Current Medications: Current Facility-Administered Medications  Medication Dose Route Frequency Provider Last Rate Last Admin  . acetaminophen (TYLENOL) tablet 650 mg  650 mg Oral Q6H PRN Antonieta Pert, MD      . alum & mag hydroxide-simeth (MAALOX/MYLANTA) 200-200-20 MG/5ML suspension 30 mL  30 mL Oral Q4H PRN Antonieta Pert, MD      . chlordiazePOXIDE (LIBRIUM) capsule 25 mg  25 mg Oral QID PRN Antonieta Pert, MD      . cloNIDine (CATAPRES) tablet 0.1 mg  0.1 mg Oral QID Antonieta Pert, MD   0.1 mg at 09/12/19 2256   Followed by  . [START ON 09/15/2019] cloNIDine (CATAPRES) tablet 0.1 mg  0.1 mg Oral BH-qamhs Jorrell Kuster, Marlane Mingle, MD       Followed by  . [START ON 09/17/2019] cloNIDine (CATAPRES) tablet 0.1 mg  0.1 mg Oral QAC breakfast Antonieta Pert, MD      . dicyclomine (BENTYL) tablet 20 mg  20 mg Oral Q6H PRN Antonieta Pert, MD   20 mg at 09/12/19 1726  . hydrOXYzine (ATARAX/VISTARIL) tablet 25 mg  25 mg Oral Q6H PRN Antonieta Pert, MD   25 mg at  09/12/19 1727  . lisinopril (ZESTRIL) tablet 10 mg  10 mg Oral Daily Antonieta Pert, MD   10 mg at 09/12/19 1615  . loperamide (IMODIUM) capsule 2-4 mg  2-4 mg Oral PRN Antonieta Pert, MD      . magnesium hydroxide (MILK OF MAGNESIA) suspension 30 mL  30 mL Oral Daily PRN Antonieta Pert, MD   30 mL at 09/12/19 1917  . methocarbamol (ROBAXIN) tablet 500 mg  500 mg Oral Q8H PRN Antonieta Pert, MD   500 mg at 09/12/19 1727  . naproxen (NAPROSYN) tablet 500 mg  500 mg Oral BID PRN Antonieta Pert, MD   500 mg at 09/12/19 1727  . ondansetron (ZOFRAN-ODT) disintegrating tablet 4 mg  4 mg Oral Q6H PRN Antonieta Pert, MD   4 mg at 09/12/19 1727  . QUEtiapine (SEROQUEL) tablet 100 mg  100 mg Oral QHS Antonieta Pert, MD   100 mg at 09/12/19 2118  . sulfamethoxazole-trimethoprim (BACTRIM) 400-80 MG per tablet 1 tablet  1 tablet Oral Q12H Antonieta Pert, MD   1 tablet at 09/12/19 2117  . traZODone (DESYREL) tablet 50 mg  50 mg Oral QHS PRN Antonieta Pert, MD       PTA Medications: Medications Prior to Admission  Medication Sig Dispense Refill Last Dose  . acetaminophen (TYLENOL) 500 MG tablet Take 1 tablet (500 mg total) by mouth every 6 (six) hours as needed for mild pain. 30 tablet 0   . amLODipine (NORVASC) 5 MG tablet Take 1 tablet (5 mg total) by mouth daily. 30 tablet 0   . ibuprofen (ADVIL) 400 MG tablet Take 1 tablet (400 mg total) by mouth every 6 (six) hours as needed for moderate pain. 16 tablet 0   . ondansetron (ZOFRAN) 4 MG tablet Take 1 tablet (4 mg total) by mouth every 8 (eight) hours as needed for up to 12 doses for nausea or vomiting. 12 tablet 0   . polyethylene glycol (MIRALAX) 17 g packet Take 17 g by mouth daily. 14 each 0   . QUEtiapine (SEROQUEL) 50 MG tablet Take 1 tablet (50 mg total) by mouth at bedtime. 30  tablet 0   . senna-docusate (SENOKOT-S) 8.6-50 MG tablet Take 2 tablets by mouth daily as needed for mild constipation. 30 tablet 1      Musculoskeletal: Strength & Muscle Tone: within normal limits Gait & Station: normal Patient leans: N/A  Psychiatric Specialty Exam: Physical Exam  Nursing note and vitals reviewed. Constitutional: He is oriented to person, place, and time. He appears well-developed and well-nourished.  HENT:  Head: Normocephalic and atraumatic.  Respiratory: Effort normal.  Neurological: He is alert and oriented to person, place, and time.    Review of Systems  Blood pressure 108/84, pulse (!) 113.There is no height or weight on file to calculate BMI.  General Appearance: Disheveled  Eye Contact:  Minimal  Speech:  Normal Rate  Volume:  Decreased  Mood:  Sleepy  Affect:  Congruent  Thought Process:  Coherent and Descriptions of Associations: Circumstantial  Orientation:  Full (Time, Place, and Person)  Thought Content:  Logical and Hallucinations: Auditory Visual  Suicidal Thoughts:  Yes.  without intent/plan  Homicidal Thoughts:  Yes.  without intent/plan  Memory:  Immediate;   Fair Recent;   Fair Remote;   Fair  Judgement:  Impaired  Insight:  Lacking  Psychomotor Activity:  Normal  Concentration:  Concentration: Fair and Attention Span: Fair  Recall:  [employer] of Knowledge:  Fair  Language:  Fair  Akathisia:  Negative  Handed:  Right  AIMS (if indicated):     Assets:  Desire for Improvement Resilience  ADL's:  Intact  Cognition:  WNL  Sleep:  Number of Hours: 5.25    Treatment Plan Summary: Daily contact with patient to assess and evaluate symptoms and progress in treatment, Medication management and Plan : Patient is seen and examined.  Patient is a 53 year old male with a past psychiatric history significant for polysubstance use disorders and dependence.  His vital signs are stable, he is afebrile.  He does not appear to be in active withdrawal from any substances.  He stated his last opiate use prior to yesterday was 7 days ago, and that he is only drinking 7 beers a  week.  It is felt that he is being manipulative with his suicidal thoughts as well as homicidal thoughts in an attempt to remain in the hospital.  I have discussed this with the patient, and I think he can be discharged to home today.  Observation Level/Precautions:  15 minute checks  Laboratory:  Chemistry Profile  Psychotherapy:    Medications:    Consultations:    Discharge Concerns:    Estimated LOS:  Other:     Physician Treatment Plan for Primary Diagnosis: <principal problem not specified> Long Term Goal(s): Improvement in symptoms so as ready for discharge  Short Term Goals: Ability to identify changes in lifestyle to reduce recurrence of condition will improve, Ability to verbalize feelings will improve and Ability to identify triggers associated with substance abuse/mental health issues will improve  Physician Treatment Plan for Secondary Diagnosis: Active Problems:   Opiate dependence ([place])  Long Term Goal(s): Improvement in symptoms so as ready for discharge  Short Term Goals: Ability to identify changes in lifestyle to reduce recurrence of condition will improve, Ability to verbalize feelings will improve and Ability to identify triggers associated with substance abuse/mental health issues will improve  I certify that inpatient services furnished can reasonably be expected to improve the patient's condition.    Sharma Covert, MD 12/17/20209:35 AM

## 2019-09-13 NOTE — Progress Notes (Signed)
Tim Morales a case manager from [employer] called wanting to coordinate care due to pts increased ER visits and hospital stays. Pt still has medicaid under the [place] part of the state and they want to coordinate any mental health substance abuse services due to an opoid use disorder.  His call back # is [phone] (confidential) or [employer] main # [phone].

## 2019-10-08 ENCOUNTER — Encounter: Payer: Self-pay | Admitting: Internal Medicine

## 2020-02-28 DIAGNOSIS — F4310 Post-traumatic stress disorder, unspecified: Secondary | ICD-10-CM | POA: Diagnosis present

## 2020-03-07 ENCOUNTER — Observation Stay (HOSPITAL_COMMUNITY)
Admission: RE | Admit: 2020-03-07 | Discharge: 2020-03-08 | Disposition: A | Discharge status: Home / Self Care | Payer: Medicaid Other | Attending: Psychiatry | Admitting: Psychiatry

## 2020-03-07 DIAGNOSIS — F329 Major depressive disorder, single episode, unspecified: Secondary | ICD-10-CM | POA: Diagnosis not present

## 2020-03-07 DIAGNOSIS — G4700 Insomnia, unspecified: Secondary | ICD-10-CM | POA: Diagnosis not present

## 2020-03-07 DIAGNOSIS — R45851 Suicidal ideations: Secondary | ICD-10-CM | POA: Insufficient documentation

## 2020-03-07 DIAGNOSIS — F1190 Opioid use, unspecified, uncomplicated: Secondary | ICD-10-CM | POA: Diagnosis not present

## 2020-03-07 DIAGNOSIS — F15951 Other stimulant use, unspecified with stimulant-induced psychotic disorder with hallucinations: Principal | ICD-10-CM | POA: Insufficient documentation

## 2020-03-07 DIAGNOSIS — F4310 Post-traumatic stress disorder, unspecified: Secondary | ICD-10-CM | POA: Diagnosis not present

## 2020-03-07 DIAGNOSIS — F19951 Other psychoactive substance use, unspecified with psychoactive substance-induced psychotic disorder with hallucinations: Secondary | ICD-10-CM | POA: Diagnosis present

## 2020-03-07 DIAGNOSIS — Z79890 Hormone replacement therapy (postmenopausal): Secondary | ICD-10-CM | POA: Diagnosis not present

## 2020-03-07 DIAGNOSIS — F1510 Other stimulant abuse, uncomplicated: Secondary | ICD-10-CM | POA: Diagnosis not present

## 2020-03-07 DIAGNOSIS — I10 Essential (primary) hypertension: Secondary | ICD-10-CM | POA: Diagnosis not present

## 2020-03-07 DIAGNOSIS — F515 Nightmare disorder: Secondary | ICD-10-CM | POA: Diagnosis not present

## 2020-03-07 DIAGNOSIS — Z20822 Contact with and (suspected) exposure to covid-19: Secondary | ICD-10-CM | POA: Diagnosis not present

## 2020-03-07 DIAGNOSIS — Z79899 Other long term (current) drug therapy: Secondary | ICD-10-CM | POA: Diagnosis not present

## 2020-03-07 HISTORY — DX: Depression, unspecified: F32.A

## 2020-03-07 HISTORY — DX: Anxiety disorder, unspecified: F41.9

## 2020-03-07 LAB — SARS CORONAVIRUS 2 BY RT PCR (HOSPITAL ORDER, PERFORMED IN ~~LOC~~ HOSPITAL LAB): SARS Coronavirus 2: NEGATIVE

## 2020-03-07 MED ORDER — RISPERIDONE 2 MG PO TBDP
2.0000 mg | ORAL_TABLET | Freq: Once | ORAL | Status: AC
  Administered 2020-03-07: 2 mg via ORAL

## 2020-03-07 MED ORDER — MAGNESIUM HYDROXIDE 400 MG/5ML PO SUSP: 30.0000 mL | Freq: Every day | ORAL | Status: DC | PRN

## 2020-03-07 MED ORDER — TRAZODONE HCL 100 MG PO TABS
100.0000 mg | ORAL_TABLET | Freq: Every evening | ORAL | Status: DC
  Administered 2020-03-07: 100 mg via ORAL
  Filled 2020-03-07: qty 1

## 2020-03-07 MED ORDER — LORAZEPAM 2 MG/ML IJ SOLN
INTRAMUSCULAR | Status: AC
  Filled 2020-03-07: qty 1

## 2020-03-07 MED ORDER — SERTRALINE HCL 50 MG PO TABS
50.0000 mg | ORAL_TABLET | Freq: Every day | ORAL | Status: DC
  Administered 2020-03-08: 50 mg via ORAL
  Filled 2020-03-07: qty 1

## 2020-03-07 MED ORDER — LORAZEPAM 2 MG/ML IJ SOLN
2.0000 mg | Freq: Once | INTRAMUSCULAR | Status: AC
  Administered 2020-03-07: 2 mg via INTRAMUSCULAR

## 2020-03-07 MED ORDER — HYDROXYZINE HCL 25 MG PO TABS
25.0000 mg | ORAL_TABLET | Freq: Three times a day (TID) | ORAL | Status: DC | PRN
  Administered 2020-03-08: 25 mg via ORAL
  Filled 2020-03-07: qty 1

## 2020-03-07 MED ORDER — LEVOTHYROXINE SODIUM 25 MCG PO TABS
25.0000 ug | ORAL_TABLET | Freq: Every day | ORAL | Status: DC
  Administered 2020-03-08: 25 ug via ORAL
  Filled 2020-03-07: qty 1

## 2020-03-07 MED ORDER — PRAZOSIN HCL 1 MG PO CAPS
4.0000 mg | ORAL_CAPSULE | Freq: Every day | ORAL | Status: DC
  Administered 2020-03-07: 4 mg via ORAL
  Filled 2020-03-07: qty 4

## 2020-03-07 MED ORDER — LIDOCAINE 5 % EX PTCH
1.0000 | MEDICATED_PATCH | Freq: Every day | CUTANEOUS | Status: DC
  Filled 2020-03-07 (×3): qty 1

## 2020-03-07 MED ORDER — VITAMIN D (ERGOCALCIFEROL) 1.25 MG (50000 UNIT) PO CAPS: 50000.0000 [IU] | ORAL_CAPSULE | ORAL | Status: DC

## 2020-03-07 MED ORDER — TRIAMCINOLONE ACETONIDE 0.1 % EX CREA
1.0000 "application " | TOPICAL_CREAM | Freq: Four times a day (QID) | CUTANEOUS | Status: DC
  Filled 2020-03-07: qty 15

## 2020-03-07 MED ORDER — ALUM & MAG HYDROXIDE-SIMETH 200-200-20 MG/5ML PO SUSP: 30.0000 mL | ORAL | Status: DC | PRN

## 2020-03-07 MED ORDER — PRAZOSIN HCL 1 MG PO CAPS: 2.0000 mg | ORAL_CAPSULE | Freq: Every day | ORAL | Status: DC

## 2020-03-07 MED ORDER — ACETAMINOPHEN 325 MG PO TABS
650.0000 mg | ORAL_TABLET | Freq: Four times a day (QID) | ORAL | Status: DC | PRN
  Administered 2020-03-08: 650 mg via ORAL
  Filled 2020-03-07: qty 2

## 2020-03-07 MED ORDER — RISPERIDONE 1 MG PO TABS
1.0000 mg | ORAL_TABLET | Freq: Two times a day (BID) | ORAL | Status: DC
  Administered 2020-03-08: 1 mg via ORAL
  Filled 2020-03-07: qty 1

## 2020-03-07 NOTE — BH Assessment (Addendum)
Assessment Note  Tim Morales is an 54 y.o. male, who presents voluntary and unaccompanied to [facility]. Clinician asked the pt, "what brought you to the hospital?" Pt reported, he's been doing meth and his girlfriend (ex-wife) killed him (by lying). Pt reported, he was married, got divorced, his ex-wife has been remarried for 20 years but told him she wanted him back and wants leave her husband. Pt reported, his ex-wife gave her husband his personal information such as, phone number, e-mail address, password, etc. Pt reported, "I feel like I want to hurt her, him and myself. Pt reported, apps have been downloaded on his phone, credit cards taken out in his name, and spy wear has been added his phone. Pt reported, when his ex-wife would call him, her husband would be listening and monitoring. Pt reported, he noticed a change in his ex-wife, she had to regain her husbands trust. Pt reported, since his ex-wife left her husband, their children (with current husband) are not speaking to her. Pt reported, went to [facility], he's been so depressed he wants to drive his car and crash it into a pole or truck. Pt reported, eight months ago, his ex-girlfriend died from an overdose. Pt reported, actively hearing his exgirlfriend laughing at him, asking  him why didn't you save me. Pt reported, seeing shadows. Pt reported, he feels his ex-wife is in on her husband monitoring and taking his identify. Pt reports, picking at his skin. Pt denies, access to weapons.   Pt reported, two days ago, he overloaded on meth. Pt reported, yesterday, drinking 2-3 beers and having a "Sex on the [facility]." Pt was provided medications while inpatient at [facility] in June 2021. Pt reported, he has not taken his medications in two days.   Pt presents irritable, with irritable speech. Pt's eye contact was fair.  Pt's eye contact was fair. Pt did not want to take of his hat or glasses, pt called himself "ugly." Pt's mood  was irritable, depressed. Pt's affect was congruent with mood. Pt's thought process was coherent, relevant. Pt's judgement was impaired. Pt was oriented x4. Pt's concentration and insight was fair. Pt's impulse control was poor. Pt reported, if discharged from [facility] he could not contract for safety.   *Pt denies, having family, friends supports.*   Diagnosis: Major Depressive Disorder, recurrent, severe with psychotic features.                     Amphetamine-type substance use disorder, severe.                    Alcohol use disorder, severe.  Past Medical History:  Past Medical History:  Diagnosis Date  . Back pain   . Bulging lumbar disc   . Hypertension     No past surgical history on file.  Family History: No family history on file.  Social History:  reports that he has never smoked. He has never used smokeless tobacco. He reports current alcohol use. He reports current drug use. Drug: Methamphetamines.  Additional Social History:  Alcohol / Drug Use Pain Medications: See MAR Prescriptions: See MAR Over the Counter: See MAR History of alcohol / drug use?: Yes Substance #1 Name of Substance 1: Methamphetamines. 1 - Age of First Use: UTA 1 - Amount (size/oz): Pt reported, he overloaded on meth. 1 - Frequency: UTA 1 - Duration: Ongoing. 1 - Last Use / Amount: Per pt, "two days ago." Substance #  2 Name of Substance 2: Alcohol. 2 - Age of First Use: UTA 2 - Amount (size/oz): Pt reported, drinking 2-3 beers and having a "Sex on the [place]." 2 - Frequency: UTA 2 - Duration: Ongoing. 2 - Last Use / Amount: Per pt, "yesterday."  CIWA:   COWS:    Allergies: No Known Allergies  Home Medications: (Not in a hospital admission)   OB/GYN Status:  No LMP for male patient.  General Assessment Data Location of Assessment: [facility] Assessment Services TTS Assessment: In system Is this a Tele or Face-to-Face Assessment?: Face-to-Face Is this an Initial Assessment or a  Re-assessment for this encounter?: Initial Assessment Patient Accompanied by:: N/A Language Other than English: No Living Arrangements: Other (Comment) ([facility] What gender do you identify as?: Male Marital status: Divorced Living Arrangements: Other (Comment) ([facility]. ) Admission Status: Voluntary Is patient capable of signing voluntary admission?: Yes Referral Source: Self/Family/Friend Insurance type: LME Medicaid.   Medical Screening Exam [facility] Walk-in ONLY) Medical Exam completed: Yes  Crisis Care Plan Living Arrangements: Other (Comment) ([facility]. ) Legal Guardian: Other: (Self. ) Name of Psychiatrist: None.  Name of Therapist: None.  Education Status Is patient currently in school?: No Is the patient employed, unemployed or receiving disability?: Unemployed  Risk to self with the past 6 months Suicidal Ideation: Yes-Currently Present Has patient been a risk to self within the past 6 months prior to admission? : Other (comment) (UTA) Suicidal Intent: Yes-Currently Present Has patient had any suicidal intent within the past 6 months prior to admission? : Other (comment) (UTA) Is patient at risk for suicide?: Yes Suicidal Plan?: Yes-Currently Present Has patient had any suicidal plan within the past 6 months prior to admission? : Other (comment) (UTA) Specify Current Suicidal Plan: Pt reported, drivign his car into a pole or truck.  Access to Means: Yes Specify Access to Suicidal Means: Pt has a car and access to roadways.  What has been your use of drugs/alcohol within the last 12 months?: Methamphetamines and alcohol.  Previous Attempts/Gestures: Yes How many times?: 1 Other Self Harm Risks: Substance use, SI, HI.  Triggers for Past Attempts: Unknown Intentional Self Injurious Behavior: Bruising Comment - Self Injurious Behavior: Pt picks his skin.  Family Suicide History: Unable to assess Recent stressful life  event(s): Conflict (Comment) (with ex-wife and her husband. ) Persecutory voices/beliefs?: Yes Depression: Yes Depression Symptoms: Feeling angry/irritable, Feeling worthless/self pity, Insomnia, Despondent Substance abuse history and/or treatment for substance abuse?: Yes Suicide prevention information given to non-admitted patients: Not applicable  Risk to Others within the past 6 months Homicidal Ideation: Yes-Currently Present Does patient have any lifetime risk of violence toward others beyond the six months prior to admission? : Yes (comment) (Pt reported, he was charged with assault on a male. ) Thoughts of Harm to Others: Yes-Currently Present Comment - Thoughts of Harm to Others: Pt wants to kill his ex-wife's current husband.  Current Homicidal Intent: Yes-Currently Present Current Homicidal Plan:  (Pt disclose he knows where he wants to bury him. ) Access to Homicidal Means: No (Pt denies. ) Identified Victim: Ex-wifes husband. Pt is unsure if he wants to kill ex-wife.  History of harm to others?: Yes Assessment of Violence:  (UTA) Violent Behavior Description: Pt was charged with assault on a male.  Does patient have access to weapons?: No (Pt denies. ) Criminal Charges Pending?: Yes Describe Pending Criminal Charges: Misdemeanor Possession of Marijuana.  Does patient  have a court date: Yes Court Date: 03/20/20 Is patient on probation?: No  Psychosis Hallucinations: Auditory, Visual Delusions: Unspecified  Mental Status Report Appearance/Hygiene: Unremarkable Eye Contact: Fair Level of Consciousness: Irritable Mood: Irritable, Depressed Affect: Other (Comment) (congruent with mood. ) Anxiety Level: Moderate Judgement: Impaired Orientation: Person, Place, Time, Situation Obsessive Compulsive Thoughts/Behaviors: Minimal  Cognitive Functioning Concentration: Fair Is patient IDD: No Insight: Fair Impulse Control: Poor Appetite: Fair Sleep: Decreased Total  Hours of Sleep:  (Pt reported, he has been up for days. ) Vegetative Symptoms: Unable to Assess  ADLScreening [facility] Assessment Services) Patient's cognitive ability adequate to safely complete daily activities?: Yes Patient able to express need for assistance with ADLs?: Yes Independently performs ADLs?: Yes (appropriate for developmental age)  Prior Inpatient Therapy Prior Inpatient Therapy: Yes Prior Therapy Dates: 02/2020  Prior Outpatient Therapy Prior Outpatient Therapy: No Does patient have an ACCT team?: No Does patient have Intensive In-House Services?  : No Does patient have [facility] services? : No Does patient have P4CC services?: No  ADL Screening (condition at time of admission) Patient's cognitive ability adequate to safely complete daily activities?: Yes Is the patient deaf or have difficulty hearing?: No Does the patient have difficulty seeing, even when wearing glasses/contacts?: No Does the patient have difficulty concentrating, remembering, or making decisions?: Yes Patient able to express need for assistance with ADLs?: Yes Does the patient have difficulty dressing or bathing?: No Independently performs ADLs?: Yes (appropriate for developmental age) Does the patient have difficulty walking or climbing stairs?: No Weakness of Legs: None (Back pain.) Weakness of Arms/Hands: None  Home Assistive Devices/Equipment Home Assistive Devices/Equipment: None    Abuse/Neglect Assessment (Assessment to be complete while patient is alone) Abuse/Neglect Assessment Can Be Completed: Yes Physical Abuse: Denies Verbal Abuse: Denies Sexual Abuse: Denies Exploitation of patient/patient's resources: Denies Self-Neglect: Denies     Regulatory affairs officer (For Healthcare) Does Patient Have a Medical Advance Directive?: No          Disposition: Lindon Romp, NP recommends pt be admitted to OBS Unit.   Disposition Initial Assessment Completed for this Encounter: Yes  On  Site Evaluation by: Vertell Novak, MS, [facility], CRC.  Reviewed with Physician: Lindon Romp, NP.  Vertell Novak 03/07/2020 8:57 PM    Vertell Novak, [place], [facility], [facility] Triage Specialist [phone]

## 2020-03-08 ENCOUNTER — Encounter (HOSPITAL_COMMUNITY): Payer: Self-pay

## 2020-03-08 ENCOUNTER — Encounter (HOSPITAL_COMMUNITY): Payer: Self-pay | Admitting: Nurse Practitioner

## 2020-03-08 ENCOUNTER — Emergency Department (HOSPITAL_COMMUNITY)
Admission: EM | Admit: 2020-03-08 | Discharge: 2020-03-08 | Disposition: A | Discharge status: Home / Self Care | Payer: Medicaid Other | Attending: Emergency Medicine | Admitting: Emergency Medicine

## 2020-03-08 ENCOUNTER — Other Ambulatory Visit: Payer: Self-pay

## 2020-03-08 DIAGNOSIS — R45851 Suicidal ideations: Secondary | ICD-10-CM

## 2020-03-08 DIAGNOSIS — F19951 Other psychoactive substance use, unspecified with psychoactive substance-induced psychotic disorder with hallucinations: Secondary | ICD-10-CM | POA: Diagnosis not present

## 2020-03-08 DIAGNOSIS — I10 Essential (primary) hypertension: Secondary | ICD-10-CM | POA: Insufficient documentation

## 2020-03-08 DIAGNOSIS — R440 Auditory hallucinations: Secondary | ICD-10-CM | POA: Diagnosis present

## 2020-03-08 DIAGNOSIS — F1510 Other stimulant abuse, uncomplicated: Secondary | ICD-10-CM | POA: Diagnosis not present

## 2020-03-08 DIAGNOSIS — Z79899 Other long term (current) drug therapy: Secondary | ICD-10-CM | POA: Diagnosis not present

## 2020-03-08 DIAGNOSIS — R45850 Homicidal ideations: Secondary | ICD-10-CM | POA: Insufficient documentation

## 2020-03-08 LAB — COMPREHENSIVE METABOLIC PANEL
ALT: 11 U/L (ref 0–44)
AST: 16 U/L (ref 15–41)
Albumin: 3.6 g/dL (ref 3.5–5.0)
Alkaline Phosphatase: 59 U/L (ref 38–126)
Anion gap: 13 (ref 5–15)
BUN: 19 mg/dL (ref 6–20)
CO2: 22 mmol/L (ref 22–32)
Calcium: 8.4 mg/dL — ABNORMAL LOW (ref 8.9–10.3)
Chloride: 106 mmol/L (ref 98–111)
Creatinine, Ser: 1.12 mg/dL (ref 0.61–1.24)
GFR calc Af Amer: >60 mL/min (ref >60)
GFR calc non Af Amer: >60 mL/min (ref >60)
Glucose, Bld: 122 mg/dL — ABNORMAL HIGH (ref 70–99)
Potassium: 3.4 mmol/L — ABNORMAL LOW (ref 3.5–5.1)
Sodium: 141 mmol/L (ref 135–145)
Total Bilirubin: 0.7 mg/dL (ref 0.3–1.2)
Total Protein: 6.5 g/dL (ref 6.5–8.1)

## 2020-03-08 LAB — CBC WITH DIFFERENTIAL/PLATELET
Abs Immature Granulocytes: 0.03 10*3/uL (ref 0.00–0.07)
Basophils Absolute: 0 10*3/uL (ref 0.0–0.1)
Basophils Relative: 1 %
Eosinophils Absolute: 0.1 10*3/uL (ref 0.0–0.5)
Eosinophils Relative: 2 %
HCT: 36.5 % — ABNORMAL LOW (ref 39.0–52.0)
Hemoglobin: 12.4 g/dL — ABNORMAL LOW (ref 13.0–17.0)
Immature Granulocytes: 1 %
Lymphocytes Relative: 29 %
Lymphs Abs: 1.8 10*3/uL (ref 0.7–4.0)
MCH: 31.8 pg (ref 26.0–34.0)
MCHC: 34 g/dL (ref 30.0–36.0)
MCV: 93.6 fL (ref 80.0–100.0)
Monocytes Absolute: 0.5 10*3/uL (ref 0.1–1.0)
Monocytes Relative: 8 %
Neutro Abs: 3.8 10*3/uL (ref 1.7–7.7)
Neutrophils Relative %: 59 %
Platelets: 245 10*3/uL (ref 150–400)
RBC: 3.9 MIL/uL — ABNORMAL LOW (ref 4.22–5.81)
RDW: 13 % (ref 11.5–15.5)
WBC: 6.2 10*3/uL (ref 4.0–10.5)
nRBC: 0 % (ref 0.0–0.2)

## 2020-03-08 LAB — ACETAMINOPHEN LEVEL: Acetaminophen (Tylenol), Serum: <10 ug/mL — ABNORMAL LOW (ref 10–30)

## 2020-03-08 LAB — ETHANOL: Alcohol, Ethyl (B): <10 mg/dL (ref <10)

## 2020-03-08 LAB — SALICYLATE LEVEL: Salicylate Lvl: <7 mg/dL — ABNORMAL LOW (ref 7.0–30.0)

## 2020-03-08 MED ORDER — LORAZEPAM 2 MG/ML IJ SOLN: 0.0000 mg | Freq: Two times a day (BID) | INTRAMUSCULAR | Status: DC

## 2020-03-08 MED ORDER — LORAZEPAM 1 MG PO TABS: 0.0000 mg | ORAL_TABLET | Freq: Two times a day (BID) | ORAL | Status: DC

## 2020-03-08 MED ORDER — LISINOPRIL 10 MG PO TABS
10.0000 mg | ORAL_TABLET | Freq: Every day | ORAL | Status: DC
  Administered 2020-03-08: 10 mg via ORAL
  Filled 2020-03-08: qty 1

## 2020-03-08 MED ORDER — RISPERIDONE 2 MG PO TBDP: 2.0000 mg | ORAL_TABLET | Freq: Three times a day (TID) | ORAL | Status: DC | PRN

## 2020-03-08 MED ORDER — POTASSIUM CHLORIDE CRYS ER 20 MEQ PO TBCR
40.0000 meq | EXTENDED_RELEASE_TABLET | Freq: Once | ORAL | Status: AC
  Administered 2020-03-08: 40 meq via ORAL
  Filled 2020-03-08: qty 2

## 2020-03-08 MED ORDER — THIAMINE HCL 100 MG PO TABS
100.0000 mg | ORAL_TABLET | Freq: Every day | ORAL | Status: DC
  Administered 2020-03-08: 100 mg via ORAL
  Filled 2020-03-08: qty 1

## 2020-03-08 MED ORDER — LORAZEPAM 1 MG PO TABS: 1.0000 mg | ORAL_TABLET | ORAL | Status: DC | PRN

## 2020-03-08 MED ORDER — ZIPRASIDONE MESYLATE 20 MG IM SOLR: 20.0000 mg | INTRAMUSCULAR | Status: DC | PRN

## 2020-03-08 MED ORDER — THIAMINE HCL 100 MG/ML IJ SOLN: 100.0000 mg | Freq: Every day | INTRAMUSCULAR | Status: DC

## 2020-03-08 MED ORDER — LORAZEPAM 2 MG/ML IJ SOLN: 0.0000 mg | Freq: Four times a day (QID) | INTRAMUSCULAR | Status: DC

## 2020-03-08 MED ORDER — LORAZEPAM 1 MG PO TABS
0.0000 mg | ORAL_TABLET | Freq: Four times a day (QID) | ORAL | Status: DC
  Administered 2020-03-08: 1 mg via ORAL
  Filled 2020-03-08: qty 1

## 2020-03-08 NOTE — ED Notes (Addendum)
Patient refused discharge paperwork 

## 2020-03-08 NOTE — Discharge Instructions (Addendum)
Please read the attached information and use the resource guide attached as well.

## 2020-03-08 NOTE — ED Provider Notes (Addendum)
[facility]   CSN: [id number] Arrival date & time: 03/08/20  1417     History No chief complaint on file.   Tim Morales is a 54 y.o. male history of polysubstance abuse, anxiety, depression, hypertension.  Patient presents today with homicidal and suicidal ideations.  Patient has a plan to kill his girlfriend's husband by shooting him.  Patient reports that he does have access to a gun.  Patient reports he is upset at his girlfriend's husband due to relationship issues.  He also plans to kill himself with a gun afterwards.  Patient reports that he has also been having auditory hallucinations with voices telling him to kill himself and occasionally sees shadows moving in the dark.  He reports that he has been using methamphetamine over the last few days.  He denies any recent illness, denies fever/chills, denies headache, denies neck stiffness, chest pain/shortness of breath, abdominal pain, nausea/vomiting, diarrhea, fall/injury, self injury, ingestion or any additional concerns. HPI     Past Medical History:  Diagnosis Date  . Anxiety   . Back pain   . Bulging lumbar disc   . Depression   . Hypertension     Patient Active Problem List   Diagnosis Date Noted  . Substance-induced psychotic disorder with hallucinations (HCC) 03/07/2020  . Post traumatic stress disorder (PTSD) 02/28/2020  . Methamphetamine use (HCC) 09/12/2019  . Opiate dependence (HCC) 09/12/2019  . AKI (acute kidney injury) (HCC) 09/08/2019  . Benzodiazepine abuse (HCC) 04/17/2019  . Opiate abuse, continuous (HCC) 04/17/2019    History reviewed. No pertinent surgical history.     No family history on file.  Social History   Tobacco Use  . Smoking status: Never Smoker  . Smokeless tobacco: Never Used  Substance Use Topics  . Alcohol use: Yes    Comment: Social  . Drug use: Yes    Types: Methamphetamines    Home Medications Prior to Admission  medications   Medication Sig Start Date End Date Taking? Authorizing Provider  acetaminophen (TYLENOL) 500 MG tablet Take 1 tablet (500 mg total) by mouth every 6 (six) hours as needed for mild pain. 09/11/19   Jae Dire, MD  amLODipine (NORVASC) 5 MG tablet Take 1 tablet (5 mg total) by mouth daily. 09/12/19   Jae Dire, MD  ergocalciferol (VITAMIN D2) 1.25 MG (50000 UT) capsule Take 50,000 Units by mouth once a week. 03/12/20   [provider]  ibuprofen (ADVIL) 400 MG tablet Take 1 tablet (400 mg total) by mouth every 6 (six) hours as needed for moderate pain. 09/11/19   Jae Dire, MD  levothyroxine (SYNTHROID) 25 MCG tablet Take 25 mcg by mouth daily. 03/06/20   [provider]  lidocaine (LIDODERM) 5 % Place 1 patch onto the skin daily. 03/06/20   [provider]  prazosin (MINIPRESS) 2 MG capsule Take 2 capsules by mouth at bedtime. 03/05/20   [provider]  QUEtiapine (SEROQUEL) 50 MG tablet Take 1 tablet (50 mg total) by mouth at bedtime. 09/11/19   Jae Dire, MD    Allergies    Patient has no known allergies.  Review of Systems   Review of Systems Ten systems are reviewed and are negative for acute change except as noted in the HPI  Physical Exam Updated Vital Signs BP 132/89 (BP Location: Left Arm)   Pulse 93   Temp 98 F (36.7 C) (Oral)   Resp 18   SpO2  96%   Physical Exam Constitutional:      General: He is not in acute distress.    Appearance: Normal appearance. He is well-developed. He is not ill-appearing or diaphoretic.  HENT:     Head: Normocephalic and atraumatic. No raccoon eyes or Battle's sign.     Jaw: There is normal jaw occlusion.     Right Ear: External ear normal.     Left Ear: External ear normal.     Nose: Nose normal.     Right Nostril: No epistaxis.     Left Nostril: No epistaxis.     Mouth/Throat:     Mouth: Mucous membranes are moist.     Pharynx: Oropharynx is clear.  Eyes:     General:  Vision grossly intact. Gaze aligned appropriately.     Pupils: Pupils are equal, round, and reactive to light.  Neck:     Trachea: Trachea and phonation normal.  Pulmonary:     Effort: Pulmonary effort is normal. No respiratory distress.  Chest:     Comments: No evidence of injury to the chest wall Abdominal:     General: There is no distension.     Palpations: Abdomen is soft.     Tenderness: There is no abdominal tenderness. There is no guarding or rebound.     Comments: No evidence of injury to the abdomen  Musculoskeletal:        General: Normal range of motion.     Cervical back: Normal range of motion.     Comments: No midline C/T/L spinal tenderness to palpation, no paraspinal muscle tenderness, no deformity, crepitus, or step-off noted. No sign of injury to the neck or back.  Moves all 4 extremities spontaneously without evidence of pain.  Skin:    General: Skin is warm and dry.     Comments: Multiple superficial wounds secondary to injection of methamphetamine.  No streaking or evidence of cellulitis.  Neurological:     Mental Status: He is alert.     GCS: GCS eye subscore is 4. GCS verbal subscore is 5. GCS motor subscore is 6.     Comments: Speech is clear and goal oriented, follows commands Major Cranial nerves without deficit, no facial droop Moves extremities without ataxia, coordination intact  Psychiatric:        Attention and Perception: He perceives auditory and visual hallucinations.        Mood and Affect: Affect is labile.        Behavior: Behavior normal. Behavior is cooperative.        Thought Content: Thought content includes homicidal and suicidal ideation. Thought content includes homicidal and suicidal plan.     ED Results / Procedures / Treatments   Labs (all labs ordered are listed, but only abnormal results are displayed) Labs Reviewed  COMPREHENSIVE METABOLIC PANEL - Abnormal; Notable for the following components:      Result Value   Potassium  3.4 (*)    Glucose, Bld 122 (*)    Calcium 8.4 (*)    All other components within normal limits  CBC WITH DIFFERENTIAL/PLATELET - Abnormal; Notable for the following components:   RBC 3.90 (*)    Hemoglobin 12.4 (*)    HCT 36.5 (*)    All other components within normal limits  ETHANOL  RAPID URINE DRUG SCREEN, HOSP PERFORMED    EKG None  Radiology No results found.  Procedures Procedures (including critical care time)  Medications Ordered in ED Medications  potassium chloride  SA (KLOR-CON) CR tablet 40 mEq (has no administration in time range)  LORazepam (ATIVAN) injection 0-4 mg (has no administration in time range)    Or  LORazepam (ATIVAN) tablet 0-4 mg (has no administration in time range)  LORazepam (ATIVAN) injection 0-4 mg (has no administration in time range)    Or  LORazepam (ATIVAN) tablet 0-4 mg (has no administration in time range)  thiamine tablet 100 mg (has no administration in time range)    Or  thiamine (B-1) injection 100 mg (has no administration in time range)    ED Course  I have reviewed the triage vital signs and the nursing notes.  Pertinent labs & imaging results that were available during my care of the patient were reviewed by me and considered in my medical decision making (see chart for details).    MDM Rules/Calculators/A&P                          Additional History Obtained: 1. Nursing notes from this visit. 2. Reviewed [facility] visit from yesterday.  Patient diagnosis substance-induced psychotic disorder with hallucination.  I cannot find a clear plan in the note. - On assessment patient is in no acute distress sitting in bed.  He is endorsing suicidal and homicidal ideations with a clear plan.  He is also experiencing visual and auditory hallucinations.  He reports he has been using methamphetamine.  He denies any toxic ingestion, injury or recent illness.  Chart review shows a negative Covid test that was obtained at 9:43 PM last night.   Plan of care is to obtain remainder of medical clearance labs, CBC, ethanol, UDS and CMP.  After medical clearance will consult psychiatry for their disposition. - CBC shows no leukocytosis suggest infection, mild anemia of 12.4 appears baseline. Ethanol negative, patient does not appear acutely intoxicated or in withdrawal. CMP shows no emergent electrolyte derangement, evidence of acute kidney injury, emergent elevation of LFTs or anion gap.  Mild hypokalemia of 3.4 noted, patient will be given 40 mEq of K-Dur.  UDS is pending. - Patient reassessed resting comfortably in bed no acute distress. - Salicylate and Tylenol level were both added.  Both of which are negative, no evidence of ingestion of the substances.   At this time there does not appear to be any evidence of an acute emergency medical condition and the patient stable for psychiatric disposition.  Patient placed in psych hold with CIWA.    Note: Portions of this report may have been transcribed using voice recognition software. Every effort was made to ensure accuracy; however, inadvertent computerized transcription errors may still be present. Final Clinical Impression(s) / ED Diagnoses Final diagnoses:  Suicidal ideation  Homicidal ideation    Rx / DC Orders ED Discharge Orders    None       Elizabeth Palau 03/08/20 1639    Elizabeth Palau 03/08/20 1731    Raeford Razor, MD 03/08/20 1829

## 2020-03-08 NOTE — Progress Notes (Signed)
Patient ID: Tim Morales, male   DOB: May 21, 1966, 54 y.o.   MRN: [id number]     D: Pt alert and oriented on the unit.   A: Education, support, and encouragement provided. Discharge summary, medications and follow up appointments reviewed with pt. Suicide prevention resources provided. Pt's belongings in locker # 49 returned and belongings sheet signed.  R: Pt denies SI/HI, A/VH or any concerns at this time. Pt ambulatory on and off unit. Pt discharged to lobby.

## 2020-03-08 NOTE — Discharge Summary (Addendum)
Physician Discharge Summary Note  Patient:  Tim Morales is an 54 y.o., male MRN:  [id number] DOB:  [birth year] Patient phone:  [phone] (home)  Patient address:   [address] [place] [address],  Total Time spent with patient: 1 hour  Date of Admission:  03/07/2020 Date of Discharge: 03/08/20  Reason for Admission:  Meth with psychosis  Principal Problem: Substance-induced psychotic disorder with hallucinations [facility]) Discharge Diagnoses: Principal Problem:   Substance-induced psychotic disorder with hallucinations (HCC) Active Problems:   Methamphetamine use (HCC)   Post traumatic stress disorder (PTSD)  Past Psychiatric History: substance use d/o, PTSD  Past Medical History:  Past Medical History:  Diagnosis Date   Anxiety    Back pain    Bulging lumbar disc    Depression    Hypertension    History reviewed. No pertinent surgical history. Family History: History reviewed. No pertinent family history. Family Psychiatric  History: none Social History:  Social History   Substance and Sexual Activity  Alcohol Use Yes   Comment: Social     Social History   Substance and Sexual Activity  Drug Use Yes   Types: Methamphetamines    Social History   Socioeconomic History   Marital status: Divorced    Spouse name: Not on file   Number of children: Not on file   Years of education: Not on file   Highest education level: Not on file  Occupational History   Not on file  Tobacco Use   Smoking status: Never Smoker   Smokeless tobacco: Never Used  Substance and Sexual Activity   Alcohol use: Yes    Comment: Social   Drug use: Yes    Types: Methamphetamines   Sexual activity: Not Currently  Other Topics Concern   Not on file  Social History Narrative   Not on file   Social Determinants of Health   Financial Resource Strain:    Difficulty of Paying Living Expenses:   Food Insecurity:    Worried About Programme researcher, broadcasting/film/video in the Last Year:    Occupational psychologist in the Last Year:   Transportation Needs:    Freight forwarder (Medical):    Lack of Transportation (Non-Medical):   Physical Activity:    Days of Exercise per Week:    Minutes of Exercise per Session:   Stress:    Feeling of Stress :   Social Connections:    Frequency of Communication with Friends and Family:    Frequency of Social Gatherings with Friends and Family:    Attends Religious Services:    Active Member of Clubs or Organizations:    Attends Banker Meetings:    Marital Status:     Hospital Course on admission per [facility]:  Tim Morales is an 54 y.o. male, who presents voluntary and unaccompanied to [facility]. Clinician asked the pt, "what brought you to the hospital?" Pt reported, he's been doing meth and his girlfriend (ex-wife) killed him (by lying). Pt reported, he was married, got divorced, his ex-wife has been remarried for 20 years but told him she wanted him back and wants leave her husband. Pt reported, his ex-wife gave her husband his personal information such as, phone number, e-mail address, password, etc. Pt reported, "I feel like I want to hurt her, him and myself. Pt reported, apps have been downloaded on his phone, credit cards taken out in his name, and spy wear has been added his phone. Pt  reported, when his ex-wife would call him, her husband would be listening and monitoring. Pt reported, he noticed a change in his ex-wife, she had to regain her husbands trust. Pt reported, since his ex-wife left her husband, their children (with current husband) are not speaking to her. Pt reported, went to [place], he's been so depressed he wants to drive his car and crash it into a pole or truck. Pt reported, eight months ago, his ex-girlfriend died from an overdose. Pt reported, actively hearing his exgirlfriend laughing at him, asking  him why didn't you save me. Pt reported, seeing shadows. Pt reported, he feels his ex-wife is in on her  husband monitoring and taking his identify. Pt reports, picking at his skin. Pt denies, access to weapons.    Pt reported, two days ago, he overloaded on meth. Pt reported, yesterday, drinking 2-3 beers and having a "Sex on the [facility]." Pt was provided medications while inpatient at [facility] in June 2021. Pt reported, he has not taken his medications in two days.   Medications restarted and client cleared the meth.  No hallucinations or suicidal/homicidal ideations, withdrawal symptoms or other concerning psychiatric issues.  Irritable on assessment, encouraged to attend his follow up at [facility].  Stable for discharge.  Musculoskeletal: Strength & Muscle Tone: within normal limits Gait & Station: normal Patient leans: N/A  Psychiatric Specialty Exam: Physical Exam  Nursing note and vitals reviewed. Constitutional: He is oriented to person, place, and time. He is cooperative.  HENT:  Head: Normocephalic.  Nose: Nose normal.  Respiratory: Effort normal.  GI: Normal appearance.  Musculoskeletal:        General: Normal range of motion.     Cervical back: Normal range of motion.  Neurological: He is alert and oriented to person, place, and time.  Psychiatric: His speech is normal and behavior is normal. Memory, judgment and thought content normal. His mood appears anxious.    Review of Systems  Psychiatric/Behavioral: Positive for dysphoric mood.  All other systems reviewed and are negative.   Blood pressure (!) 128/95, pulse 89, temperature 98.2 F (36.8 C), temperature source Oral, resp. rate 18, SpO2 100 %.There is no height or weight on file to calculate BMI.  General Appearance: Disheveled  Eye Contact:  Good  Speech:  Normal Rate  Volume:  Normal  Mood:  Irritable  Affect:  Congruent  Thought Process:  Coherent and Descriptions of Associations: Intact  Orientation:  Full (Time, Place, and Person)  Thought Content:  WDL and Logical  Suicidal Thoughts:  No  Homicidal  Thoughts:  No  Memory:  Immediate;   Good Recent;   Good Remote;   Good  Judgement:  Fair  Insight:  Fair  Psychomotor Activity:  Normal  Concentration:  Concentration: Good and Attention Span: Good  Recall:  Good  Fund of Knowledge:  Fair  Language:  Good  Akathisia:  No  Handed:  Right  AIMS (if indicated):     Assets:  Leisure Time Physical Health Resilience  ADL's:  Intact  Cognition:  WNL  Sleep:           Has this patient used any form of tobacco in the last 30 days? (Cigarettes, Smokeless Tobacco, Cigars, and/or Pipes) Yes, Yes, A prescription for an FDA-approved tobacco cessation medication was offered at discharge and the patient refused  Blood Alcohol level:  Lab Results  Component Value Date   ETH <10 09/05/2019   ETH <10 04/10/2019  Metabolic Disorder Labs:  Lab Results  Component Value Date   HGBA1C 5.3 09/08/2019   MPG 105.41 09/08/2019   No results found for: PROLACTIN Lab Results  Component Value Date   CHOL 203 (H) 09/08/2019   TRIG 153 (H) 09/08/2019   HDL 28 (L) 09/08/2019   CHOLHDL 7.3 09/08/2019   VLDL 31 09/08/2019   LDLCALC 144 (H) 09/08/2019    See Psychiatric Specialty Exam and Suicide Risk Assessment completed by Attending Physician prior to discharge.  Discharge destination:  Home  Is patient on multiple antipsychotic therapies at discharge:  No   Has Patient had three or more failed trials of antipsychotic monotherapy by history:  No  Recommended Plan for Multiple Antipsychotic Therapies: NA     Follow-up recommendations:  Activity:  as tolerated Diet:  heart healthy diet   Methamphetamine induced psychosis with hallucinations: -Continue Seroquel 50 mg at bedtime  Nightmares: -Continue Prazosin 2 mg at bedtime  Comments:  Follow up with [facility]  Signed: Nanine Means, NP 03/08/2020, 10:40 AM Patient seen face-to-face for psychiatric evaluation, chart reviewed and case discussed with the physician extender and developed  treatment plan. Reviewed the information documented and agree with the treatment plan. Thedore Mins, MD

## 2020-03-08 NOTE — H&P (Signed)
BH Observation Unit Provider Admission PAA/H&P  Patient Identification: Tim Morales MRN:  [id number] Date of Evaluation:  03/08/2020 Chief Complaint:  Substance-induced psychotic disorder with hallucinations (HCC) [F19.951] Principal Diagnosis: Substance-induced psychotic disorder with hallucinations (HCC) Diagnosis:  Principal Problem:   Substance-induced psychotic disorder with hallucinations (HCC) Active Problems:   Methamphetamine use (HCC)   Post traumatic stress disorder (PTSD)  History of Present Illness:  TTS Assessment:  Tim Morales is an 54 y.o. male, who presents voluntary and unaccompanied to [facility] Clinician asked the pt, "what brought you to the hospital?" Pt reported, he's been doing meth and his girlfriend (ex-wife) killed him (by lying). Pt reported, he was married, got divorced, his ex-wife has been remarried for 20 years but told him she wanted him back and wants leave her husband. Pt reported, his ex-wife gave her husband his personal information such as, phone number, e-mail address, password, etc. Pt reported, "I feel like I want to hurt her, him and myself. Pt reported, apps have been downloaded on his phone, credit cards taken out in his name, and spy wear has been added his phone. Pt reported, when his ex-wife would call him, her husband would be listening and monitoring. Pt reported, he noticed a change in his ex-wife, she had to regain her husbands trust. Pt reported, since his ex-wife left her husband, their children (with current husband) are not speaking to her. Pt reported, went to [facility], he's been so depressed he wants to drive his car and crash it into a pole or truck. Pt reported, eight months ago, his ex-girlfriend died from an overdose. Pt reported, actively hearing his exgirlfriend laughing at him, asking  him why didn't you save me. Pt reported, seeing shadows. Pt reported, he feels his ex-wife is in on her husband monitoring and taking his  identify. Pt reports, picking at his skin. Pt denies, access to weapons.   Pt reported, two days ago, he overloaded on meth. Pt reported, yesterday, drinking 2-3 beers and having a "Sex on the [facility]." Pt was provided medications while inpatient at [facility] in June 2021. Pt reported, he has not taken his medications in two days.    Evaluation on Unit: Reviewed TTS assessment and validated with patient. Patient was discharged from [facility] on  on 03/05/2020 after being admitted on 02/23/2020 for what appears to be similar circumstances. On evaluation patient is alert and oriented x 4. He is irritable and requires frequent redirection. Speech is clear and coherent. Mood is depressed/anxious. Affect is labile. He reports that he hears voices that are getting worst. States that he will kim himself if he cannot get the vices under control. He requests xanax and risperdal to help with the hallucinations and his anxiety. Endorses suicidal ideations with thoughts of wrecking his car. Reports that he has been using "a shit load of drugs" the past two days.    Associated Signs/Symptoms: Depression Symptoms:  depressed mood, anhedonia, insomnia, psychomotor agitation, feelings of worthlessness/guilt, difficulty concentrating, hopelessness, suicidal thoughts without plan, anxiety, (Hypo) Manic Symptoms:  Distractibility, Elevated Mood, Hallucinations, Impulsivity, Irritable Mood, Anxiety Symptoms:  Excessive Worry, Psychotic Symptoms:  Hallucinations: Auditory PTSD Symptoms: Re-experiencing:  Nightmares Total Time spent with patient: 45 minutes  Past Psychiatric History:  Patient was discharged from [facility] on  on 03/05/2020 after being admitted on 02/23/2020 for what appears to be similar circumstances. He was inpatient at [facility] 08/2019. History of benzodiazepine abuse, methamphetamine use, PTSD, MDD.  Is the patient at risk to self? Yes.    Has the  patient been a risk to self in the past 6 months? Yes.    Has the patient been a risk to self within the distant past? Yes.    Is the patient a risk to others? Yes.    Has the patient been a risk to others in the past 6 months? Yes.    Has the patient been a risk to others within the distant past? Yes.     Prior Inpatient Therapy: Prior Inpatient Therapy: Yes Prior Therapy Dates: 02/2020 Prior Outpatient Therapy: Prior Outpatient Therapy: No Does patient have an ACCT team?: No Does patient have Intensive In-House Services?  : No Does patient have [facility] services? : No Does patient have P4CC services?: No  Alcohol Screening: 1. How often do you have a drink containing alcohol?: 2 to 3 times a week 2. How many drinks containing alcohol do you have on a typical day when you are drinking?: 5 or 6 3. How often do you have six or more drinks on one occasion?: Daily or almost daily AUDIT-C Score: 9 4. How often during the last year have you found that you were not able to stop drinking once you had started?: Daily or almost daily 5. How often during the last year have you failed to do what was normally expected from you because of drinking?: Daily or almost daily 6. How often during the last year have you needed a first drink in the morning to get yourself going after a heavy drinking session?: Daily or almost daily 7. How often during the last year have you had a feeling of guilt of remorse after drinking?: Less than monthly 8. How often during the last year have you been unable to remember what happened the night before because you had been drinking?: Less than monthly 9. Have you or someone else been injured as a result of your drinking?: No 10. Has a relative or friend or a doctor or another health worker been concerned about your drinking or suggested you cut down?: No Alcohol Use Disorder Identification Test Final Score (AUDIT): 23 Alcohol Brief Interventions/Follow-up: Continued  Monitoring Substance Abuse History in the last 12 months:  Yes.   Consequences of Substance Abuse: Family Consequences:  Family discord Previous Psychotropic Medications: Yes  Psychological Evaluations: No  Past Medical History:  Past Medical History:  Diagnosis Date  . Anxiety   . Back pain   . Bulging lumbar disc   . Depression   . Hypertension    History reviewed. No pertinent surgical history. Family History: History reviewed. No pertinent family history. Family Psychiatric History: Unknown Tobacco Screening:   Social History:  Social History   Substance and Sexual Activity  Alcohol Use Yes   Comment: Social     Social History   Substance and Sexual Activity  Drug Use Yes  . Types: Methamphetamines    Additional Social History: Marital status: Divorced    Pain Medications: See MAR Prescriptions: See MAR Over the Counter: See MAR History of alcohol / drug use?: Yes Name of Substance 1: Methamphetamines. 1 - Age of First Use: UTA 1 - Amount (size/oz): Pt reported, he overloaded on meth. 1 - Frequency: UTA 1 - Duration: Ongoing. 1 - Last Use / Amount: Per pt, "two days ago." Name of Substance 2: Alcohol. 2 - Age of First Use: UTA 2 - Amount (size/oz): Pt reported, drinking 2-3 beers and having a "Sex  on the [facility]." 2 - Frequency: UTA 2 - Duration: Ongoing. 2 - Last Use / Amount: Per pt, "yesterday."       Allergies:  No Known Allergies Lab Results:  Results for orders placed or performed during the hospital encounter of 03/07/20 (from the past 48 hour(s))  SARS Coronavirus 2 by RT PCR (hospital order, performed in [facility] hospital lab) Nasopharyngeal Nasopharyngeal Swab     Status: None   Collection Time: 03/07/20  9:43 PM   Specimen: Nasopharyngeal Swab  Result Value Ref Range   SARS Coronavirus 2 NEGATIVE NEGATIVE    Comment: (NOTE) SARS-CoV-2 target nucleic acids are NOT DETECTED.  The SARS-CoV-2 RNA is generally detectable in upper and  lower respiratory specimens during the acute phase of infection. The lowest concentration of SARS-CoV-2 viral copies this assay can detect is 250 copies / mL. A negative result does not preclude SARS-CoV-2 infection and should not be used as the sole basis for treatment or other patient management decisions.  A negative result may occur with improper specimen collection / handling, submission of specimen other than nasopharyngeal swab, presence of viral mutation(s) within the areas targeted by this assay, and inadequate number of viral copies (<250 copies / mL). A negative result must be combined with clinical observations, patient history, and epidemiological information.  Fact Sheet for Patients:   BoilerBrush.com.cy  Fact Sheet for Healthcare Providers: https://pope.com/  This test is not yet approved or  cleared by the [place] FDA and has been authorized for detection and/or diagnosis of SARS-CoV-2 by FDA under an Emergency Use Authorization (EUA).  This EUA will remain in effect (meaning this test can be used) for the duration of the COVID-19 declaration under Section 564(b)(1) of the Act, 21 U.S.C. section 360bbb-3(b)(1), unless the authorization is terminated or revoked sooner.  Performed at [facility], [address] [place], [place] [address]     Blood Alcohol level:  Lab Results  Component Value Date   [facility] <10 09/05/2019   ETH <10 04/10/2019    Metabolic Disorder Labs:  Lab Results  Component Value Date   HGBA1C 5.3 09/08/2019   MPG 105.41 09/08/2019   No results found for: PROLACTIN Lab Results  Component Value Date   CHOL 203 (H) 09/08/2019   TRIG 153 (H) 09/08/2019   HDL 28 (L) 09/08/2019   CHOLHDL 7.3 09/08/2019   VLDL 31 09/08/2019   LDLCALC 144 (H) 09/08/2019    Current Medications: Current Facility-Administered Medications  Medication Dose Route Frequency Provider Last  Rate Last Admin  . acetaminophen (TYLENOL) tablet 650 mg  650 mg Oral Q6H PRN Jackelyn Poling, NP      . alum & mag hydroxide-simeth (MAALOX/MYLANTA) 200-200-20 MG/5ML suspension 30 mL  30 mL Oral Q4H PRN Nira Conn A, NP      . hydrOXYzine (ATARAX/VISTARIL) tablet 25 mg  25 mg Oral TID PRN Jackelyn Poling, NP      . levothyroxine (SYNTHROID) tablet 25 mcg  25 mcg Oral Daily Nira Conn A, NP      . lidocaine (LIDODERM) 5 % 1 patch  1 patch Transdermal Daily Nira Conn A, NP      . lisinopril (ZESTRIL) tablet 10 mg  10 mg Oral Daily Nira Conn A, NP      . risperiDONE (RISPERDAL M-TABS) disintegrating tablet 2 mg  2 mg Oral Q8H PRN Jackelyn Poling, NP       And  . LORazepam (ATIVAN) tablet 1  mg  1 mg Oral PRN Nira ConnBerry, Denna Fryberger A, NP       And  . ziprasidone (GEODON) injection 20 mg  20 mg Intramuscular PRN Nira ConnBerry, Desiree Daise A, NP      . magnesium hydroxide (MILK OF MAGNESIA) suspension 30 mL  30 mL Oral Daily PRN Nira ConnBerry, Zylon Creamer A, NP      . prazosin (MINIPRESS) capsule 4 mg  4 mg Oral QHS Nira ConnBerry, Carollee Nussbaumer A, NP   4 mg at 03/07/20 2254  . risperiDONE (RISPERDAL) tablet 1 mg  1 mg Oral BID Nira ConnBerry, Lisbeth Puller A, NP      . sertraline (ZOLOFT) tablet 50 mg  50 mg Oral Daily Nira ConnBerry, Mathieu Schloemer A, NP      . traZODone (DESYREL) tablet 100 mg  100 mg Oral QPM Nira ConnBerry, Elihue Ebert A, NP   100 mg at 03/07/20 2254  . triamcinolone cream (KENALOG) 0.1 % 1 application  1 application Topical QID Jackelyn PolingBerry, Ramir Malerba A, NP      . Melene Muller[START ON 03/12/2020] Vitamin D (Ergocalciferol) (DRISDOL) capsule 50,000 Units  50,000 Units Oral Weekly Nira ConnBerry, Keyondre Hepburn A, NP       PTA Medications: Medications Prior to Admission  Medication Sig Dispense Refill Last Dose  . [START ON 03/12/2020] ergocalciferol (VITAMIN D2) 1.25 MG (50000 UT) capsule Take 50,000 Units by mouth once a week.     . levothyroxine (SYNTHROID) 25 MCG tablet Take 25 mcg by mouth daily.     Marland Kitchen. lidocaine (LIDODERM) 5 % Place 1 patch onto the skin daily.     Marland Kitchen. lisinopril (ZESTRIL) 10 MG tablet Take  10 mg by mouth daily.     . prazosin (MINIPRESS) 2 MG capsule Take 2 capsules by mouth at bedtime.     . risperiDONE (RISPERDAL) 1 MG tablet Take 1 tablet by mouth 2 (two) times daily.     . sertraline (ZOLOFT) 50 MG tablet Take 1 tablet by mouth daily.     . traZODone (DESYREL) 100 MG tablet Take 1 tablet by mouth every evening.     . triamcinolone cream (KENALOG) 0.1 % Apply 1 application topically 4 (four) times daily.     Marland Kitchen. acetaminophen (TYLENOL) 500 MG tablet Take 1 tablet (500 mg total) by mouth every 6 (six) hours as needed for mild pain. 30 tablet 0   . amLODipine (NORVASC) 5 MG tablet Take 1 tablet (5 mg total) by mouth daily. 30 tablet 0   . ibuprofen (ADVIL) 400 MG tablet Take 1 tablet (400 mg total) by mouth every 6 (six) hours as needed for moderate pain. 16 tablet 0   . ondansetron (ZOFRAN) 4 MG tablet Take 1 tablet (4 mg total) by mouth every 8 (eight) hours as needed for up to 12 doses for nausea or vomiting. 12 tablet 0   . polyethylene glycol (MIRALAX) 17 g packet Take 17 g by mouth daily. 14 each 0   . QUEtiapine (SEROQUEL) 50 MG tablet Take 1 tablet (50 mg total) by mouth at bedtime. 30 tablet 0   . senna-docusate (SENOKOT-S) 8.6-50 MG tablet Take 2 tablets by mouth daily as needed for mild constipation. 30 tablet 1   . sulfamethoxazole-trimethoprim (BACTRIM) 400-80 MG tablet Take 1 tablet by mouth every 12 (twelve) hours. 10 tablet 0     Musculoskeletal: Strength & Muscle Tone: within normal limits Gait & Station: normal Patient leans: N/A  Psychiatric Specialty Exam: Physical Exam  Constitutional: He is oriented to person, place, and time.  Non-toxic appearance. He does not  appear ill. No distress.  HENT:  Right Ear: External ear normal.  Left Ear: External ear normal.  Eyes: Pupils are equal, round, and reactive to light.  Respiratory: Effort normal. No respiratory distress.  Neurological: He is alert and oriented to person, place, and time.  Skin: He is not  diaphoretic.  Psychiatric: His speech is normal. His mood appears anxious. His affect is labile. He is agitated. Thought content is not paranoid and not delusional. He expresses impulsivity and inappropriate judgment. He exhibits a depressed mood. He expresses suicidal ideation. He expresses no homicidal ideation. He expresses suicidal plans.    Review of Systems  Constitutional: Negative for activity change, appetite change, chills, diaphoresis, fatigue, fever and unexpected weight change.  HENT: Negative for congestion.   Respiratory: Negative for cough, choking and shortness of breath.   Cardiovascular: Negative for chest pain and palpitations.  Gastrointestinal: Negative for diarrhea, nausea and vomiting.  Skin: Positive for wound.  Neurological: Negative for dizziness.  Psychiatric/Behavioral: Positive for decreased concentration, dysphoric mood, hallucinations, sleep disturbance and suicidal ideas. The patient is nervous/anxious.   All other systems reviewed and are negative.   Blood pressure (!) 186/125, pulse (!) 58, temperature 98.2 F (36.8 C), temperature source Oral, resp. rate 18, SpO2 100 %.There is no height or weight on file to calculate BMI.  General Appearance: Disheveled  Eye Contact:  Minimal  Speech:  Clear and Coherent and Normal Rate  Volume:  Increased  Mood:  Anxious, Depressed, Hopeless, Irritable and Worthless  Affect:  Labile  Thought Process:  Coherent  Orientation:  Full (Time, Place, and Person)  Thought Content:  Hallucinations: Auditory and Rumination  Suicidal Thoughts:  Yes.  without intent/plan  Homicidal Thoughts:  No  Memory:  Immediate;   Fair Recent;   Fair Remote;   Fair  Judgement:  Impaired  Insight:  Lacking  Psychomotor Activity:  Restlessness  Concentration:  Concentration: Poor and Attention Span: Poor  Recall:  [employer] of Knowledge:  Fair  Language:  Good  Akathisia:  Negative  Handed:  Right  AIMS (if indicated):     Assets:   Leisure Time Physical Health  ADL's:  Intact  Cognition:  WNL  Sleep:         Treatment Plan Summary: Daily contact with patient to assess and evaluate symptoms and progress in treatment and Medication management  Observation Level/Precautions:  15 minute checks Laboratory:  CBC Chemistry Profile UDS Psychotherapy:  Individual Medications:  Risperdal 1 mg PO BID for mood stability Zoloft 50 mg PO Daily for depression Prazosin 4 mg PO QHS for nightmares Trazodone 100 mg PO QHS for sleep Triamcinolone 0.1% cream QID Lidoderm patch daily for pain Synthroid 25 mcg daily for hypothyroidism Vitamin D 50,000 units weekly for nutritional supplementation  Agitation protocol     Consultations: peer support, social work  Discharge Concerns:  Safety, outpatient follow-up Estimated LOS: Other:      Rozetta Nunnery, NP 6/12/20215:18 AM

## 2020-03-08 NOTE — BH Assessment (Addendum)
BHH Assessment Progress Note  Patient is well known to Behavioral Health having been assessed and discharged from [facility] earlier this date by Nanine Means, DNP and Dr Jannifer Franklin was involved in this decision making process.  Patient is chronically suicidal and he is a substance abuser.  Patient was admitted to [facility] back in December and was not compliant with his aftercare and he has continued to use substances.  Patient has chronic suicidal thoughts and has been inconsistent with his reports of owning or having access to a gun. He left [facility] and walked across the street to the [facility] seeking admission.to [facility] for the secondary gain for housing. He also has a pending court date for drug related charges in [facility] to be heard in Court on 03/10/2020 and he is most likely trying to continue his case.   However, patient has already been assessed this date and was not deemed to be appropriate for admission. TTS spoke to Nanine Means, DNP, who feels like patient should be discharged from the ED.

## 2020-03-08 NOTE — Discharge Instructions (Signed)
[facility]  [place] Address: [address] [place], [place], [place] [address] Hours:  Closed ? Oneita Jolly Phone: [phone]  [facility]  Rehabilitation center in [place] Address: [address] [place], [place] [address] Hours:  Closed ? Opens 6:30AM Wynelle Link

## 2020-03-08 NOTE — ED Triage Notes (Signed)
Patient presenting to the ed with c/o suicide.Patient state he has been hearing voices telling him to kill himself and others.

## 2020-03-08 NOTE — ED Provider Notes (Addendum)
Discussed with on-call counselor who has evaluated the patient and recommends discharge at this time.  It is felt that patient's presentation to ED today is for secondary gain as he checked out of behavioral health this morning after being psychiatrically cleared.  He is also noted to have a court date on Monday.   I saw the patient at bedside and discussed plan to discharge.  He is understanding of plan.  I discussed with Dr. Juleen China before discharging patient.   I provided patient with psychiatric resources.   Gailen Shelter, [place] 03/08/20 2022    Raeford Razor, MD 03/09/20 1531

## 2020-03-08 NOTE — Progress Notes (Signed)
Patient ID: Tim Morales, male   DOB: [birth year], 54 y.o.   MRN: [id number] This is a 54 Y.O male who presents voluntarily reporting that he has been abusing alcohol and drugs and has not been taking his medications. Reports that he is hearing voices that threaten him and he is afraid to stay in the room alone. Patient is talking to himself, yelling.  Patient is irritable, angry and frequently redirected. Reporting that he has not been able to sleep and requesting medications " just give me a shot to make me sleep...". Patient frequently threatening to leave "if that doctor doesn't come ". Presents with multiple scars  allover and reports that he picks his skin when he is stressed. Patient  Is restless and helpless. Was seen by NP and medications ordered. Patient received Ativan 2mg  IM, Risperdal and Trazodone. Presents with increased BP: received minipress (BP 186/125). Patient   Currently in bed sleeping. Patient will be monitored in Observation to be reevaluated in AM.

## 2020-03-08 NOTE — BHH Suicide Risk Assessment (Cosign Needed)
Suicide Risk Assessment     BHH Discharge Suicide Risk Assessment   Principal Problem: Substance-induced psychotic disorder with hallucinations (HCC) Discharge Diagnoses: Principal Problem:   Substance-induced psychotic disorder with hallucinations (HCC) Active Problems:   Methamphetamine use (HCC)   Post traumatic stress disorder (PTSD)   Total Time spent with patient: 45 minutes  Musculoskeletal: Strength & Muscle Tone: within normal limits Gait & Station: normal Patient leans: N/A  Psychiatric Specialty Exam: @ROSBYAGE @  Blood pressure (!) 128/95, pulse 89, temperature 98.2 F (36.8 C), temperature source Oral, resp. rate 18, SpO2 100 %.There is no height or weight on file to calculate BMI.  General Appearance: Disheveled  Eye Contact::  Good  Speech:  Normal Rate409  Volume:  Normal  Mood:  Irritable  Affect:  Congruent  Thought Process:  Coherent and Descriptions of Associations: Intact  Orientation:  Full (Time, Place, and Person)  Thought Content:  WDL and Logical  Suicidal Thoughts:  No  Homicidal Thoughts:  No  Memory:  Immediate;   Good Recent;   Good Remote;   Good  Judgement:  Fair  Insight:  Fair  Psychomotor Activity:  Normal  Concentration:  Good  Recall:  Good  Fund of Knowledge:Fair  Language: Good  Akathisia:  No  Handed:  Right  AIMS (if indicated):     Assets:  Leisure Time Physical Health Resilience  Sleep:     Cognition: WNL  ADL's:  Intact   Mental Status Per Nursing Assessment::   On Admission:  NA  Demographic Factors:  Male and Caucasian  Loss Factors: NA  Historical Factors: NA  Risk Reduction Factors:   Sense of responsibility to family and Positive social support  Continued Clinical Symptoms:  Irritability  Cognitive Features That Contribute To Risk:  None    Suicide Risk:  Minimal: No identifiable suicidal ideation.  Patients presenting with no risk factors but with morbid ruminations; may be classified as  minimal risk based on the severity of the depressive symptoms   Plan Of Care/Follow-up recommendations:  Methamphetamine induced psychosis with hallucinations: -Continue Seroquel 50 mg at bedtime  Nightmares: -Continue Prazosin 2 mg at bedtime Activity:  as tolerated Diet:  heart healthy diet  [phone], NP 03/08/2020, 10:42 AM

## 2020-03-08 NOTE — Progress Notes (Addendum)
Patient has remained asleep with no sign of distress. Lab rescheduled for 1800 today. Safety monitored as expected.  Shift report given to Sharren Bridge., RN

## 2020-03-11 ENCOUNTER — Encounter (HOSPITAL_COMMUNITY): Payer: Self-pay | Admitting: Emergency Medicine

## 2020-03-11 ENCOUNTER — Emergency Department (HOSPITAL_COMMUNITY)
Admission: EM | Admit: 2020-03-11 | Discharge: 2020-03-12 | Disposition: A | Discharge status: Other Acute Inpatient Hospital | Payer: Medicaid Other | Attending: Emergency Medicine | Admitting: Emergency Medicine

## 2020-03-11 ENCOUNTER — Other Ambulatory Visit: Payer: Self-pay

## 2020-03-11 DIAGNOSIS — F329 Major depressive disorder, single episode, unspecified: Secondary | ICD-10-CM | POA: Diagnosis present

## 2020-03-11 DIAGNOSIS — I10 Essential (primary) hypertension: Secondary | ICD-10-CM | POA: Insufficient documentation

## 2020-03-11 DIAGNOSIS — R45851 Suicidal ideations: Secondary | ICD-10-CM | POA: Insufficient documentation

## 2020-03-11 LAB — COMPREHENSIVE METABOLIC PANEL
ALT: 13 U/L (ref 0–44)
AST: 13 U/L — ABNORMAL LOW (ref 15–41)
Albumin: 4.2 g/dL (ref 3.5–5.0)
Alkaline Phosphatase: 55 U/L (ref 38–126)
Anion gap: 11 (ref 5–15)
BUN: 20 mg/dL (ref 6–20)
CO2: 24 mmol/L (ref 22–32)
Calcium: 8.8 mg/dL — ABNORMAL LOW (ref 8.9–10.3)
Chloride: 104 mmol/L (ref 98–111)
Creatinine, Ser: 1.28 mg/dL — ABNORMAL HIGH (ref 0.61–1.24)
GFR calc Af Amer: >60 mL/min (ref >60)
GFR calc non Af Amer: >60 mL/min (ref >60)
Glucose, Bld: 107 mg/dL — ABNORMAL HIGH (ref 70–99)
Potassium: 4 mmol/L (ref 3.5–5.1)
Sodium: 139 mmol/L (ref 135–145)
Total Bilirubin: 0.9 mg/dL (ref 0.3–1.2)
Total Protein: 7.6 g/dL (ref 6.5–8.1)

## 2020-03-11 LAB — CBC WITH DIFFERENTIAL/PLATELET
Abs Immature Granulocytes: 0.04 10*3/uL (ref 0.00–0.07)
Basophils Absolute: 0 10*3/uL (ref 0.0–0.1)
Basophils Relative: 0 %
Eosinophils Absolute: 0.1 10*3/uL (ref 0.0–0.5)
Eosinophils Relative: 1 %
HCT: 40.8 % (ref 39.0–52.0)
Hemoglobin: 13.6 g/dL (ref 13.0–17.0)
Immature Granulocytes: 1 %
Lymphocytes Relative: 25 %
Lymphs Abs: 1.8 10*3/uL (ref 0.7–4.0)
MCH: 30.8 pg (ref 26.0–34.0)
MCHC: 33.3 g/dL (ref 30.0–36.0)
MCV: 92.3 fL (ref 80.0–100.0)
Monocytes Absolute: 0.5 10*3/uL (ref 0.1–1.0)
Monocytes Relative: 7 %
Neutro Abs: 4.9 10*3/uL (ref 1.7–7.7)
Neutrophils Relative %: 66 %
Platelets: 228 10*3/uL (ref 150–400)
RBC: 4.42 MIL/uL (ref 4.22–5.81)
RDW: 12.8 % (ref 11.5–15.5)
WBC: 7.5 10*3/uL (ref 4.0–10.5)
nRBC: 0 % (ref 0.0–0.2)

## 2020-03-11 LAB — ETHANOL: Alcohol, Ethyl (B): <10 mg/dL (ref <10)

## 2020-03-11 LAB — RAPID URINE DRUG SCREEN, HOSP PERFORMED
Amphetamines: NOT DETECTED
Barbiturates: NOT DETECTED
Benzodiazepines: NOT DETECTED
Cocaine: NOT DETECTED
Opiates: NOT DETECTED
Tetrahydrocannabinol: NOT DETECTED

## 2020-03-11 NOTE — ED Triage Notes (Signed)
Patient here from home reporting that he was at a facility and was placed on multiple meds. States that he was discharged and was unable to get meds. Unable to afford meds.

## 2020-03-11 NOTE — ED Provider Notes (Signed)
[facility] DEPT Provider Note   CSN: [id number] Arrival date & time: 03/11/20  1654     History Chief Complaint  Patient presents with  . Suicidal    Tim Morales is a 54 y.o. male.  Patient states SI but no plan, not taking medications just prescribed to him because he cant afford them and doesn't think they helped.   The history is provided by the patient.  Mental Health Problem Presenting symptoms: suicidal thoughts   Degree of incapacity (severity):  Mild Onset quality:  Gradual Timing:  Intermittent Progression:  Waxing and waning Chronicity:  Chronic Context: noncompliance   Treatment compliance:  Untreated Relieved by:  Nothing Worsened by:  Drugs Associated symptoms: no abdominal pain and no chest pain   Risk factors: hx of mental illness        Past Medical History:  Diagnosis Date  . Anxiety   . Back pain   . Bulging lumbar disc   . Depression   . Hypertension     Patient Active Problem List   Diagnosis Date Noted  . 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 Post traumatic stress disorder (PTSD) 02/28/2020  . Methamphetamine use ([place]) 09/12/2019  . Opiate dependence ([place]) 09/12/2019  . AKI (acute kidney injury) ([place]) 09/08/2019  . Benzodiazepine abuse ([place]) 04/17/2019  . Opiate abuse, continuous ([place]) 04/17/2019    History reviewed. No pertinent surgical history.     No family history on file.  Social History   Tobacco Use  . Smoking status: Never Smoker  . Smokeless tobacco: Never Used  Substance Use Topics  . Alcohol use: Yes    Comment: Social  . Drug use: Yes    Types: Methamphetamines    Home Medications Prior to Admission medications   Medication Sig Start Date End Date Taking? Authorizing Provider  acetaminophen (TYLENOL) 500 MG tablet Take 1 tablet (500 mg total) by mouth every 6 (six) hours as needed for mild pain. 09/11/19  Yes Harold Hedge, MD    amLODipine (NORVASC) 5 MG tablet Take 1 tablet (5 mg total) by mouth daily. 09/12/19  Yes Harold Hedge, MD  ergocalciferol (VITAMIN D2) 1.25 MG (50000 UT) capsule Take 50,000 Units by mouth once a week. 03/12/20  Yes [provider]  hydrOXYzine (ATARAX/VISTARIL) 25 MG tablet Take 25 mg by mouth 3 (three) times daily as needed for anxiety or itching.   Yes [provider]  ibuprofen (ADVIL) 400 MG tablet Take 1 tablet (400 mg total) by mouth every 6 (six) hours as needed for moderate pain. 09/11/19  Yes Harold Hedge, MD  levothyroxine (SYNTHROID) 25 MCG tablet Take 25 mcg by mouth daily. 03/06/20  Yes [provider]  lidocaine (LIDODERM) 5 % Place 1 patch onto the skin daily. 03/06/20  Yes [provider]  lisinopril (ZESTRIL) 20 MG tablet Take 20 mg by mouth daily.   Yes [provider]  mirtazapine (REMERON) 30 MG tablet Take 30 mg by mouth at bedtime.   Yes [provider]  prazosin (MINIPRESS) 2 MG capsule Take 2 capsules by mouth at bedtime. 03/05/20  Yes [provider]  QUEtiapine (SEROQUEL) 50 MG tablet Take 1 tablet (50 mg total) by mouth at bedtime. 09/11/19  Yes Harold Hedge, MD  tiZANidine (ZANAFLEX) 4 MG tablet Take 4 mg by mouth every 6 (six) hours as needed for muscle spasms.  01/14/20  Yes [provider]  traZODone (DESYREL) 100 MG tablet  Take 100 mg by mouth at bedtime.  03/09/20  Yes [provider]    Allergies    Patient has no known allergies.  Review of Systems   Review of Systems  Constitutional: Negative for chills and fever.  HENT: Negative for ear pain and sore throat.   Eyes: Negative for pain and visual disturbance.  Respiratory: Negative for cough and shortness of breath.   Cardiovascular: Negative for chest pain and palpitations.  Gastrointestinal: Negative for abdominal pain and vomiting.  Genitourinary: Negative for dysuria and hematuria.  Musculoskeletal: Negative for  arthralgias and back pain.  Skin: Negative for color change and rash.  Neurological: Negative for seizures and syncope.  Psychiatric/Behavioral: Positive for suicidal ideas.  All other systems reviewed and are negative.   Physical Exam Updated Vital Signs BP (!) 137/95 (BP Location: Left Arm)   Pulse (!) 104   Temp 98.6 F (37 C) (Oral)   Resp 16   SpO2 97%   Physical Exam Vitals and nursing note reviewed.  Constitutional:      General: He is not in acute distress.    Appearance: He is well-developed. He is not ill-appearing.  HENT:     Head: Normocephalic and atraumatic.     Mouth/Throat:     Mouth: Mucous membranes are moist.  Eyes:     Extraocular Movements: Extraocular movements intact.     Conjunctiva/sclera: Conjunctivae normal.     Pupils: Pupils are equal, round, and reactive to light.  Cardiovascular:     Rate and Rhythm: Normal rate and regular rhythm.     Pulses: Normal pulses.     Heart sounds: Normal heart sounds. No murmur heard.   Pulmonary:     Effort: Pulmonary effort is normal. No respiratory distress.     Breath sounds: Normal breath sounds.  Abdominal:     Palpations: Abdomen is soft.     Tenderness: There is no abdominal tenderness.  Musculoskeletal:        General: Normal range of motion.     Cervical back: Normal range of motion and neck supple.  Skin:    General: Skin is warm and dry.  Neurological:     Mental Status: He is alert.  Psychiatric:        Thought Content: Thought content includes suicidal ideation.     ED Results / Procedures / Treatments   Labs (all labs ordered are listed, but only abnormal results are displayed) Labs Reviewed  COMPREHENSIVE METABOLIC PANEL - Abnormal; Notable for the following components:      Result Value   Glucose, Bld 107 (*)    Creatinine, Ser 1.28 (*)    Calcium 8.8 (*)    AST 13 (*)    All other components within normal limits  RAPID URINE DRUG SCREEN, HOSP PERFORMED  CBC WITH  DIFFERENTIAL/PLATELET  ETHANOL    EKG None  Radiology No results found.  Procedures Procedures (including critical care time)  Medications Ordered in ED Medications - No data to display  ED Course  I have reviewed the triage vital signs and the nursing notes.  Pertinent labs & imaging results that were available during my care of the patient were reviewed by me and considered in my medical decision making (see chart for details).    MDM Rules/Calculators/A&P                          Tim Morales is a 54 year old male with polysubstance  abuse, PTSD who presents to the ED with suicidal ideation.  Patient with normal vitals.  No fever.  Patient mostly concerned about his medication.  Was just hospitalized and discharged on new psych medications.  He states that he did not think that they were helping him when he was inpatient so he did not fill them.  He also cannot afford his medications.  He states that he has suicidal thoughts but no plan.  Overall he appears to not have any active suicidal ideation.  He appears mostly here for medication adjustment and resources to pay for those medications.  He did not feel like he needed to go to [facility] today after asking why he has not followed up.  We will have him evaluated by psychiatry to see if they can make medication adjustment and recommendations therefore recommend social work help fill those medications.  We will get basic labs in case they do want to keep him.  Medically cleared.  Awaiting psych recs.  This chart was dictated using voice recognition software.  Despite best efforts to proofread,  errors can occur which can change the documentation meaning.    Final Clinical Impression(s) / ED Diagnoses Final diagnoses:  Suicidal ideations    Rx / DC Orders ED Discharge Orders    None       Virgina Norfolk, DO 03/11/20 1935

## 2020-03-11 NOTE — ED Notes (Signed)
Patient has been wanded and patients has one personal belonging bag and a aqua duffle bag behind the desk

## 2020-03-11 NOTE — ED Notes (Signed)
Below order not completed by EW. 

## 2020-03-12 ENCOUNTER — Ambulatory Visit (HOSPITAL_COMMUNITY)
Admission: EM | Admit: 2020-03-12 | Discharge: 2020-03-12 | Disposition: A | Discharge status: Home / Self Care | Payer: Medicaid Other | Source: Home / Self Care | Attending: Registered Nurse | Admitting: Registered Nurse

## 2020-03-12 ENCOUNTER — Other Ambulatory Visit: Payer: Self-pay

## 2020-03-12 DIAGNOSIS — F1020 Alcohol dependence, uncomplicated: Secondary | ICD-10-CM

## 2020-03-12 DIAGNOSIS — F4310 Post-traumatic stress disorder, unspecified: Secondary | ICD-10-CM

## 2020-03-12 DIAGNOSIS — F1920 Other psychoactive substance dependence, uncomplicated: Secondary | ICD-10-CM

## 2020-03-12 DIAGNOSIS — F1510 Other stimulant abuse, uncomplicated: Secondary | ICD-10-CM

## 2020-03-12 MED ORDER — ADULT MULTIVITAMIN W/MINERALS CH
1.0000 | ORAL_TABLET | Freq: Every day | ORAL | Status: DC
  Administered 2020-03-12: 1 via ORAL
  Filled 2020-03-12: qty 14
  Filled 2020-03-12: qty 1

## 2020-03-12 MED ORDER — PRAZOSIN HCL 2 MG PO CAPS
4.0000 mg | ORAL_CAPSULE | Freq: Every day | ORAL | Status: DC
  Filled 2020-03-12: qty 2

## 2020-03-12 MED ORDER — HYDROXYZINE HCL 25 MG PO TABS
25.0000 mg | ORAL_TABLET | Freq: Three times a day (TID) | ORAL | Status: DC | PRN
  Administered 2020-03-12: 25 mg via ORAL
  Filled 2020-03-12: qty 1

## 2020-03-12 MED ORDER — LEVOTHYROXINE SODIUM 25 MCG PO TABS: 25.0000 ug | ORAL_TABLET | Freq: Every day | ORAL | 0 refills | Status: AC

## 2020-03-12 MED ORDER — MIRTAZAPINE 30 MG PO TABS
30.0000 mg | ORAL_TABLET | Freq: Every day | ORAL | Status: DC
  Filled 2020-03-12: qty 14

## 2020-03-12 MED ORDER — PRAZOSIN HCL 1 MG PO CAPS
4.0000 mg | ORAL_CAPSULE | Freq: Every day | ORAL | Status: DC
  Administered 2020-03-12: 4 mg via ORAL
  Filled 2020-03-12: qty 4

## 2020-03-12 MED ORDER — MIRTAZAPINE 30 MG PO TABS: 30.0000 mg | ORAL_TABLET | Freq: Every day | ORAL | 0 refills | Status: AC

## 2020-03-12 MED ORDER — LEVOTHYROXINE SODIUM 25 MCG PO TABS
25.0000 ug | ORAL_TABLET | Freq: Every day | ORAL | Status: DC
  Administered 2020-03-12: 25 ug via ORAL
  Filled 2020-03-12 (×2): qty 1

## 2020-03-12 MED ORDER — AMLODIPINE BESYLATE 5 MG PO TABS
5.0000 mg | ORAL_TABLET | Freq: Every day | ORAL | Status: DC
  Administered 2020-03-12: 5 mg via ORAL
  Filled 2020-03-12: qty 14
  Filled 2020-03-12: qty 1

## 2020-03-12 MED ORDER — LEVOTHYROXINE SODIUM 25 MCG PO TABS: 25.0000 ug | ORAL_TABLET | Freq: Every day | ORAL | Status: DC

## 2020-03-12 MED ORDER — THIAMINE HCL 100 MG PO TABS: 100.0000 mg | ORAL_TABLET | Freq: Every day | ORAL | Status: DC

## 2020-03-12 MED ORDER — QUETIAPINE FUMARATE 50 MG PO TABS: 50.0000 mg | ORAL_TABLET | Freq: Every day | ORAL | Status: DC

## 2020-03-12 MED ORDER — ONDANSETRON 4 MG PO TBDP: 4.0000 mg | ORAL_TABLET | Freq: Four times a day (QID) | ORAL | Status: DC | PRN

## 2020-03-12 MED ORDER — HYDROXYZINE HCL 25 MG PO TABS
25.0000 mg | ORAL_TABLET | Freq: Four times a day (QID) | ORAL | Status: DC | PRN
  Filled 2020-03-12: qty 20

## 2020-03-12 MED ORDER — LISINOPRIL 20 MG PO TABS: 20.0000 mg | ORAL_TABLET | Freq: Every day | ORAL | Status: DC

## 2020-03-12 MED ORDER — AMLODIPINE BESYLATE 5 MG PO TABS: 5.0000 mg | ORAL_TABLET | Freq: Every day | ORAL | Status: DC

## 2020-03-12 MED ORDER — LISINOPRIL 20 MG PO TABS
20.0000 mg | ORAL_TABLET | Freq: Every day | ORAL | Status: DC
  Administered 2020-03-12: 20 mg via ORAL
  Filled 2020-03-12: qty 1
  Filled 2020-03-12: qty 14

## 2020-03-12 MED ORDER — AMLODIPINE BESYLATE 5 MG PO TABS: 5.0000 mg | ORAL_TABLET | Freq: Every day | ORAL | 0 refills | Status: AC

## 2020-03-12 MED ORDER — HYDROXYZINE HCL 25 MG PO TABS: 25.0000 mg | ORAL_TABLET | Freq: Three times a day (TID) | ORAL | 0 refills | Status: AC | PRN

## 2020-03-12 MED ORDER — PRAZOSIN HCL 2 MG PO CAPS: 4.0000 mg | ORAL_CAPSULE | Freq: Every day | ORAL | 0 refills | Status: AC

## 2020-03-12 MED ORDER — QUETIAPINE FUMARATE 50 MG PO TABS
50.0000 mg | ORAL_TABLET | Freq: Every day | ORAL | Status: DC
  Administered 2020-03-12: 50 mg via ORAL
  Filled 2020-03-12: qty 1

## 2020-03-12 MED ORDER — QUETIAPINE FUMARATE 50 MG PO TABS: 50.0000 mg | ORAL_TABLET | Freq: Every day | ORAL | 0 refills | Status: AC

## 2020-03-12 MED ORDER — CHLORDIAZEPOXIDE HCL 25 MG PO CAPS: 25.0000 mg | ORAL_CAPSULE | Freq: Four times a day (QID) | ORAL | Status: DC | PRN

## 2020-03-12 MED ORDER — HYDROXYZINE HCL 25 MG PO TABS
25.0000 mg | ORAL_TABLET | Freq: Three times a day (TID) | ORAL | Status: DC | PRN
  Filled 2020-03-12 (×2): qty 20

## 2020-03-12 MED ORDER — LISINOPRIL 20 MG PO TABS: 20.0000 mg | ORAL_TABLET | Freq: Every day | ORAL | 0 refills | Status: AC

## 2020-03-12 MED ORDER — LOPERAMIDE HCL 2 MG PO CAPS: 2.0000 mg | ORAL_CAPSULE | ORAL | Status: DC | PRN

## 2020-03-12 MED ORDER — MIRTAZAPINE 30 MG PO TABS
30.0000 mg | ORAL_TABLET | Freq: Every day | ORAL | Status: DC
  Administered 2020-03-12: 30 mg via ORAL
  Filled 2020-03-12: qty 1

## 2020-03-12 NOTE — ED Provider Notes (Signed)
[facility]/OBS ASAP Discharge Summary  Date and Time: 03/12/2020 4:20 PM  Name: Tim Morales  MRN:  [id number]   Discharge Diagnoses:  Final diagnoses:  Post traumatic stress disorder (PTSD)  Methamphetamine use (HCC)  Polysubstance dependence including opioid type drug without complication, episodic abuse (HCC)  Alcohol use disorder, moderate, dependence (HCC)    Subjective: "I just need help getting into rehab"   Stay Summary: Patient admitted to continuous observation this morning requesting assistance with rehab services.   During evaluation Tim Morales is alert/oriented x 4; calm/cooperative; and mood is congruent with affect.  He does not appear to be responding to internal/external stimuli or delusional thoughts.  Patient denies suicidal/self-harm/homicidal ideation, psychosis, and paranoia.  Patient answered question appropriately.    Total Time spent with patient: 30 minutes  Past Psychiatric History: See above Past Medical History:  Past Medical History:  Diagnosis Date  . Anxiety   . Back pain   . Bulging lumbar disc   . Depression   . Hypertension    No past surgical history on file. Family History: No family history on file. Family Psychiatric History: Unaware Social History:  Social History   Substance and Sexual Activity  Alcohol Use Yes   Comment: Social     Social History   Substance and Sexual Activity  Drug Use Yes  . Types: Methamphetamines    Social History   Socioeconomic History  . Marital status: Divorced    Spouse name: Not on file  . Number of children: Not on file  . Years of education: Not on file  . Highest education level: Not on file  Occupational History  . Not on file  Tobacco Use  . Smoking status: Never Smoker  . Smokeless tobacco: Never Used  Substance and Sexual Activity  . Alcohol use: Yes    Comment: Social  . Drug use: Yes    Types: Methamphetamines  . Sexual activity: Not Currently  Other Topics Concern  . Not on file   Social History Narrative  . Not on file   Social Determinants of Health   Financial Resource Strain:   . Difficulty of Paying Living Expenses:   Food Insecurity:   . Worried About Programme researcher, broadcasting/film/video in the Last Year:   . Barista in the Last Year:   Transportation Needs:   . Freight forwarder (Medical):   Marland Kitchen Lack of Transportation (Non-Medical):   Physical Activity:   . Days of Exercise per Week:   . Minutes of Exercise per Session:   Stress:   . Feeling of Stress :   Social Connections:   . Frequency of Communication with Friends and Family:   . Frequency of Social Gatherings with Friends and Family:   . Attends Religious Services:   . Active Member of Clubs or Organizations:   . Attends Banker Meetings:   Marland Kitchen Marital Status:    SDOH:  SDOH Screenings   Alcohol Screen: Medium Risk  . Last Alcohol Screening Score (AUDIT): 23  Depression (PHQ2-9):   . PHQ-2 Score:   Financial Resource Strain:   . Difficulty of Paying Living Expenses:   Food Insecurity:   . Worried About Programme researcher, broadcasting/film/video in the Last Year:   . [employer] of Food in the Last Year:   Housing:   . Last Housing Risk Score:   Physical Activity:   . Days of Exercise per Week:   . Minutes of Exercise per Session:  Social Connections:   . Frequency of Communication with Friends and Family:   . Frequency of Social Gatherings with Friends and Family:   . Attends Religious Services:   . Active Member of Clubs or Organizations:   . Attends Banker Meetings:   Marland Kitchen Marital Status:   Stress:   . Feeling of Stress :   Tobacco Use: Low Risk   . Smoking Tobacco Use: Never Smoker  . Smokeless Tobacco Use: Never Used  Transportation Needs:   . Freight forwarder (Medical):   Marland Kitchen Lack of Transportation (Non-Medical):     Has this patient used any form of tobacco in the last 30 days? (Cigarettes, Smokeless Tobacco, Cigars, and/or Pipes) A prescription for an FDA-approved  tobacco cessation medication was offered at discharge and the patient refused  Current Medications:  Current Facility-Administered Medications  Medication Dose Route Frequency Provider Last Rate Last Admin  . amLODipine (NORVASC) tablet 5 mg  5 mg Oral Daily Lianah Peed B, NP   5 mg at 03/12/20 1617  . chlordiazePOXIDE (LIBRIUM) capsule 25 mg  25 mg Oral Q6H PRN Krystl Wickware B, NP      . hydrOXYzine (ATARAX/VISTARIL) tablet 25 mg  25 mg Oral Q6H PRN Kodie Kishi B, NP      . levothyroxine (SYNTHROID) tablet 25 mcg  25 mcg Oral Daily Devereaux Grayson B, NP   25 mcg at 03/12/20 1612  . lisinopril (ZESTRIL) tablet 20 mg  20 mg Oral Daily Lexine Jaspers B, NP   20 mg at 03/12/20 1612  . loperamide (IMODIUM) capsule 2-4 mg  2-4 mg Oral PRN Almadelia Looman B, NP      . mirtazapine (REMERON) tablet 30 mg  30 mg Oral QHS Jamarco Zaldivar B, NP      . multivitamin with minerals tablet 1 tablet  1 tablet Oral Daily Azarie Coriz B, NP   1 tablet at 03/12/20 1611  . ondansetron (ZOFRAN-ODT) disintegrating tablet 4 mg  4 mg Oral Q6H PRN Jhene Westmoreland B, NP      . prazosin (MINIPRESS) capsule 4 mg  4 mg Oral QHS Darrien Belter B, NP      . QUEtiapine (SEROQUEL) tablet 50 mg  50 mg Oral QHS Valencia Kassa B, NP      . [START ON 03/13/2020] thiamine tablet 100 mg  100 mg Oral Daily Zameer Borman B, NP       Current Outpatient Medications  Medication Sig Dispense Refill  . acetaminophen (TYLENOL) 500 MG tablet Take 1 tablet (500 mg total) by mouth every 6 (six) hours as needed for mild pain. 30 tablet 0  . amLODipine (NORVASC) 5 MG tablet Take 1 tablet (5 mg total) by mouth daily. 7 tablet 0  . ergocalciferol (VITAMIN D2) 1.25 MG (50000 UT) capsule Take 50,000 Units by mouth once a week.    . hydrOXYzine (ATARAX/VISTARIL) 25 MG tablet Take 1 tablet (25 mg total) by mouth 3 (three) times daily as needed for anxiety or itching. 14 tablet 0  . ibuprofen (ADVIL) 400 MG tablet Take 1 tablet (400 mg total) by  mouth every 6 (six) hours as needed for moderate pain. 16 tablet 0  . levothyroxine (SYNTHROID) 25 MCG tablet Take 1 tablet (25 mcg total) by mouth daily. 7 tablet 0  . lidocaine (LIDODERM) 5 % Place 1 patch onto the skin daily.    Marland Kitchen lisinopril (ZESTRIL) 20 MG tablet Take 1 tablet (20 mg total) by mouth daily. 7 tablet 0  .  mirtazapine (REMERON) 30 MG tablet Take 1 tablet (30 mg total) by mouth at bedtime. 7 tablet 0  . prazosin (MINIPRESS) 2 MG capsule Take 2 capsules (4 mg total) by mouth at bedtime. 7 capsule 0  . QUEtiapine (SEROQUEL) 50 MG tablet Take 1 tablet (50 mg total) by mouth at bedtime for 7 days. 7 tablet 0  . tiZANidine (ZANAFLEX) 4 MG tablet Take 4 mg by mouth every 6 (six) hours as needed for muscle spasms.     . traZODone (DESYREL) 100 MG tablet Take 100 mg by mouth at bedtime.       PTA Medications: (Not in a hospital admission)   Musculoskeletal  Strength & Muscle Tone: within normal limits Gait & Station: normal Patient leans: N/A  Psychiatric Specialty Exam  Presentation  General Appearance: No data recorded Eye Contact:Good  Speech:Clear and Coherent;Normal Rate  Speech Volume:Normal  Handedness:Right   Mood and Affect  Mood:Anxious  Affect:Appropriate;Congruent   Thought Process  Thought Processes:Coherent;Goal Directed (Patient stating that he wants to go to rehab)  Descriptions of Associations:Intact  Orientation:Full (Time, Place and Person)  Thought Content:WDL  Hallucinations:Hallucinations: None  Ideas of Reference:None  Suicidal Thoughts:Suicidal Thoughts: No (Patient stating he said he was suicidal because he thought it would help him to get into a rehab facility quicker)  Homicidal Thoughts:No data recorded  Sensorium  Memory:Immediate Good;Remote Good;Recent Good  Judgment:Intact  Insight:Present   Executive Functions  Concentration:Good  Attention Span:Good  [place]  Language:Good   Psychomotor Activity  Psychomotor Activity:Psychomotor Activity: Normal (No noted signs or symptoms of withdrawl noted)   Assets  Assets:Communication Skills;Leisure Time;Desire for Improvement   Sleep  Sleep:Sleep: Good   Physical Exam  Physical Exam ROS Blood pressure (!) 121/92, pulse 81, temperature 97.8 F (36.6 C), temperature source Oral, resp. rate 20, height 5\' 10"  (1.778 m), weight 81.6 kg, SpO2 99 %. Body mass index is 25.83 kg/m.  Demographic Factors:  Male, Caucasian, Low socioeconomic status, Living alone and Unemployed  Loss Factors: Homeless  Historical Factors: Impulsivity  Risk Reduction Factors:   Religious beliefs about death  Continued Clinical Symptoms:  Alcohol/Substance Abuse/Dependencies Previous Psychiatric Diagnoses and Treatments  Cognitive Features That Contribute To Risk:  None    Suicide Risk:  Minimal: No identifiable suicidal ideation.  Patients presenting with no risk factors but with morbid ruminations; may be classified as minimal risk based on the severity of the depressive symptoms  Plan Of Care/Follow-up recommendations:  Activity:  As tolerated Diet:  Heart healthy  Disposition: Patient discharged to go to [facility]) one week sample of medications given  Jessenya Berdan, NP 03/12/2020, 4:20 PM

## 2020-03-12 NOTE — ED Notes (Addendum)
Spoke to [place], Charity fundraiser at [employer]. She states she does not know what area he will need to go too and will call me back in 15 minutes.

## 2020-03-12 NOTE — ED Notes (Signed)
TTS attempted to do pt's assessment at 2:30am, pt is asleep and all nighttime meds had been given

## 2020-03-12 NOTE — ED Provider Notes (Signed)
Behavioral Health Admission H&P [facility] & [facility]BS)  Date:  03/12/2020 Patient Name: Tim Morales MRN: [id number] Chief Complaint:  Chief Complaint  Patient presents with  . Addiction Problem    Seeking rehab services for alcohol and polysubstance use      Diagnoses:  Final diagnoses:  Post traumatic stress disorder (PTSD)  Methamphetamine use (HCC)  Polysubstance dependence including opioid type drug without complication, episodic abuse (HCC)  Alcohol use disorder, moderate, dependence (HCC)    HPI: Patient presenting to [facility] from [facility] ED where he was seen via telepsych by this provider.  Patient requesting assistance to get into a rehab facility for substance use.  Patient reporting polysubstance  PHQ 2-9: No score     Total Time spent with patient: 30 minutes  Musculoskeletal  Strength & Muscle Tone: within normal limits Gait & Station: normal Patient leans: N/A  Psychiatric Specialty Exam  Presentation General Appearance: No data recorded Eye Contact:Good  Speech:Clear and Coherent;Normal Rate  Speech Volume:Normal  Handedness:Right   Mood and Affect  Mood:Anxious  Affect:Appropriate;Congruent   Thought Process  Thought Processes:Coherent;Goal Directed (Patient stating that he wants to go to rehab)  Descriptions of Associations:Intact  Orientation:Full (Time, Place and Person)  Thought Content:WDL  Hallucinations:Hallucinations: None  Ideas of Reference:None  Suicidal Thoughts:Suicidal Thoughts: No (Patient stating he said he was suicidal because he thought it would help him to get into a rehab facility quicker)  Homicidal Thoughts:No data recorded  Sensorium  Memory:Immediate Good;Remote Good;Recent Good  Judgment:Intact  Insight:Present   Executive Functions  Concentration:Good  Attention Span:Good  Recall:Good  Fund of Knowledge:Fair  Language:Good   Psychomotor Activity  Psychomotor Activity:Psychomotor Activity: Normal (No  noted signs or symptoms of withdrawl noted)   Assets  Assets:Communication Skills;Leisure Time;Desire for Improvement   Sleep  Sleep:Sleep: Good   Physical Exam Vitals and nursing note reviewed.  Pulmonary:     Effort: Pulmonary effort is normal.  Musculoskeletal:        General: Normal range of motion.     Cervical back: Normal range of motion.  Skin:    General: Skin is warm and dry.  Neurological:     Mental Status: He is alert.  Psychiatric:        Attention and Perception: Attention and perception normal.        Mood and Affect: Mood and affect normal.        Speech: Speech normal.        Behavior: Behavior normal. Behavior is cooperative.        Thought Content: Thought content normal.        Cognition and Memory: Cognition normal.        Judgment: Judgment normal.    Review of Systems  Psychiatric/Behavioral: Negative for hallucinations and memory loss. The patient is not nervous/anxious and does not have insomnia.   All other systems reviewed and are negative.   Blood pressure (!) 121/92, pulse 81, temperature 97.8 F (36.6 C), temperature source Oral, resp. rate 20, height 5\' 10"  (1.778 m), weight 180 lb (81.6 kg), SpO2 99 %. Body mass index is 25.83 kg/m.  Past Psychiatric History: Depression, alcohol and polysubstance abuse   Is the patient at risk to self? No  Has the patient been a risk to self in the past 6 months? No .    Has the patient been a risk to self within the distant past? No   Is the patient a risk to others? No   Has the  patient been a risk to others in the past 6 months? No   Has the patient been a risk to others within the distant past? No   Past Medical History:  Past Medical History:  Diagnosis Date  . Anxiety   . Back pain   . Bulging lumbar disc   . Depression   . Hypertension    No past surgical history on file.  Family History: No family history on file.  Social History:  Social History   Socioeconomic History  .  Marital status: Divorced    Spouse name: Not on file  . Number of children: Not on file  . Years of education: Not on file  . Highest education level: Not on file  Occupational History  . Not on file  Tobacco Use  . Smoking status: Never Smoker  . Smokeless tobacco: Never Used  Substance and Sexual Activity  . Alcohol use: Yes    Comment: Social  . Drug use: Yes    Types: Methamphetamines  . Sexual activity: Not Currently  Other Topics Concern  . Not on file  Social History Narrative  . Not on file   Social Determinants of Health   Financial Resource Strain:   . Difficulty of Paying Living Expenses:   Food Insecurity:   . Worried About Programme researcher, broadcasting/film/video in the Last Year:   . Barista in the Last Year:   Transportation Needs:   . Freight forwarder (Medical):   Marland Kitchen Lack of Transportation (Non-Medical):   Physical Activity:   . Days of Exercise per Week:   . Minutes of Exercise per Session:   Stress:   . Feeling of Stress :   Social Connections:   . Frequency of Communication with Friends and Family:   . Frequency of Social Gatherings with Friends and Family:   . Attends Religious Services:   . Active Member of Clubs or Organizations:   . Attends Banker Meetings:   Marland Kitchen Marital Status:   Intimate Partner Violence:   . Fear of Current or Ex-Partner:   . Emotionally Abused:   Marland Kitchen Physically Abused:   . Sexually Abused:     SDOH:  SDOH Screenings   Alcohol Screen: Medium Risk  . Last Alcohol Screening Score (AUDIT): 23  Depression (PHQ2-9):   . PHQ-2 Score:   Financial Resource Strain:   . Difficulty of Paying Living Expenses:   Food Insecurity:   . Worried About Programme researcher, broadcasting/film/video in the Last Year:   . [employer] of Food in the Last Year:   Housing:   . Last Housing Risk Score:   Physical Activity:   . Days of Exercise per Week:   . Minutes of Exercise per Session:   Social Connections:   . Frequency of Communication with Friends and  Family:   . Frequency of Social Gatherings with Friends and Family:   . Attends Religious Services:   . Active Member of Clubs or Organizations:   . Attends Banker Meetings:   Marland Kitchen Marital Status:   Stress:   . Feeling of Stress :   Tobacco Use: Low Risk   . Smoking Tobacco Use: Never Smoker  . Smokeless Tobacco Use: Never Used  Transportation Needs:   . Freight forwarder (Medical):   Marland Kitchen Lack of Transportation (Non-Medical):     Last Labs:  Admission on 03/11/2020, Discharged on 03/12/2020  Component Date Value Ref Range Status  . Opiates  03/11/2020 NONE DETECTED  NONE DETECTED Final  . Cocaine 03/11/2020 NONE DETECTED  NONE DETECTED Final  . Benzodiazepines 03/11/2020 NONE DETECTED  NONE DETECTED Final  . Amphetamines 03/11/2020 NONE DETECTED  NONE DETECTED Final  . Tetrahydrocannabinol 03/11/2020 NONE DETECTED  NONE DETECTED Final  . Barbiturates 03/11/2020 NONE DETECTED  NONE DETECTED Final   Comment: (NOTE) DRUG SCREEN FOR MEDICAL PURPOSES ONLY.  IF CONFIRMATION IS NEEDED FOR ANY PURPOSE, NOTIFY LAB WITHIN 5 DAYS.  LOWEST DETECTABLE LIMITS FOR URINE DRUG SCREEN Drug Class                     Cutoff (ng/mL) Amphetamine and metabolites    1000 Barbiturate and metabolites    200 Benzodiazepine                 366 Tricyclics and metabolites     300 Opiates and metabolites        300 Cocaine and metabolites        300 THC                            50 Performed at [facility], [place] [address], [place], [place] [address]   . WBC 03/11/2020 7.5  4.0 - 10.5 K/uL Final  . RBC 03/11/2020 4.42  4.22 - 5.81 MIL/uL Final  . Hemoglobin 03/11/2020 13.6  13.0 - 17.0 g/dL Final  . HCT 03/11/2020 40.8  39 - 52 % Final  . MCV 03/11/2020 92.3  80.0 - 100.0 fL Final  . MCH 03/11/2020 30.8  26.0 - 34.0 pg Final  . MCHC 03/11/2020 33.3  30.0 - 36.0 g/dL Final  . RDW 03/11/2020 12.8  11.5 - 15.5 % Final  . Platelets 03/11/2020 228  150 - 400 K/uL  Final  . nRBC 03/11/2020 0.0  0.0 - 0.2 % Final  . Neutrophils Relative % 03/11/2020 66  % Final  . Neutro Abs 03/11/2020 4.9  1.7 - 7.7 K/uL Final  . Lymphocytes Relative 03/11/2020 25  % Final  . Lymphs Abs 03/11/2020 1.8  0.7 - 4.0 K/uL Final  . Monocytes Relative 03/11/2020 7  % Final  . Monocytes Absolute 03/11/2020 0.5  0 - 1 K/uL Final  . Eosinophils Relative 03/11/2020 1  % Final  . Eosinophils Absolute 03/11/2020 0.1  0 - 0 K/uL Final  . Basophils Relative 03/11/2020 0  % Final  . Basophils Absolute 03/11/2020 0.0  0 - 0 K/uL Final  . Immature Granulocytes 03/11/2020 1  % Final  . Abs Immature Granulocytes 03/11/2020 0.04  0.00 - 0.07 K/uL Final   Performed at [facility], [place] [address], [place], [place] [address]  . Sodium 03/11/2020 139  135 - 145 mmol/L Final  . Potassium 03/11/2020 4.0  3.5 - 5.1 mmol/L Final  . Chloride 03/11/2020 104  98 - 111 mmol/L Final  . CO2 03/11/2020 24  22 - 32 mmol/L Final  . Glucose, Bld 03/11/2020 107* 70 - 99 mg/dL Final   Glucose reference range applies only to samples taken after fasting for at least 8 hours.  . BUN 03/11/2020 20  6 - 20 mg/dL Final  . Creatinine, Ser 03/11/2020 1.28* 0.61 - 1.24 mg/dL Final  . Calcium 03/11/2020 8.8* 8.9 - 10.3 mg/dL Final  . Total Protein 03/11/2020 7.6  6.5 - 8.1 g/dL Final  . Albumin 03/11/2020 4.2  3.5 - 5.0 g/dL Final  .  AST 03/11/2020 13* 15 - 41 U/L Final  . ALT 03/11/2020 13  0 - 44 U/L Final  . Alkaline Phosphatase 03/11/2020 55  38 - 126 U/L Final  . Total Bilirubin 03/11/2020 0.9  0.3 - 1.2 mg/dL Final  . GFR calc non Af Amer 03/11/2020 >60  >60 mL/min Final  . GFR calc Af Amer 03/11/2020 >60  >60 mL/min Final  . Anion gap 03/11/2020 11  5 - 15 Final   Performed at [facility], [address] [place], [place] [address]  . Alcohol, Ethyl (B) 03/11/2020 <10  <10 mg/dL Final   Comment: (NOTE) Lowest detectable limit for serum alcohol is 10  mg/dL.  For medical purposes only. Performed at [facility], [address] [place], [place] [address]   Admission on 03/08/2020, Discharged on 03/08/2020  Component Date Value Ref Range Status  . Sodium 03/08/2020 141  135 - 145 mmol/L Final  . Potassium 03/08/2020 3.4* 3.5 - 5.1 mmol/L Final  . Chloride 03/08/2020 106  98 - 111 mmol/L Final  . CO2 03/08/2020 22  22 - 32 mmol/L Final  . Glucose, Bld 03/08/2020 122* 70 - 99 mg/dL Final   Glucose reference range applies only to samples taken after fasting for at least 8 hours.  . BUN 03/08/2020 19  6 - 20 mg/dL Final  . Creatinine, Ser 03/08/2020 1.12  0.61 - 1.24 mg/dL Final  . Calcium 17/00/1749 8.4* 8.9 - 10.3 mg/dL Final  . Total Protein 03/08/2020 6.5  6.5 - 8.1 g/dL Final  . Albumin 44/96/7591 3.6  3.5 - 5.0 g/dL Final  . AST 63/84/6659 16  15 - 41 U/L Final  . ALT 03/08/2020 11  0 - 44 U/L Final  . Alkaline Phosphatase 03/08/2020 59  38 - 126 U/L Final  . Total Bilirubin 03/08/2020 0.7  0.3 - 1.2 mg/dL Final  . GFR calc non Af Amer 03/08/2020 >60  >60 mL/min Final  . GFR calc Af Amer 03/08/2020 >60  >60 mL/min Final  . Anion gap 03/08/2020 13  5 - 15 Final   Performed at [facility], [address], [place], [place] [address]  . Alcohol, Ethyl (B) 03/08/2020 <10  <10 mg/dL Final   Comment: (NOTE) Lowest detectable limit for serum alcohol is 10 mg/dL.  For medical purposes only. Performed at [facility], [address] [place], [place] [address]   . WBC 03/08/2020 6.2  4.0 - 10.5 K/uL Final  . RBC 03/08/2020 3.90* 4.22 - 5.81 MIL/uL Final  . Hemoglobin 03/08/2020 12.4* 13.0 - 17.0 g/dL Final  . HCT 90/30/0923 36.5* 39 - 52 % Final  . MCV 03/08/2020 93.6  80.0 - 100.0 fL Final  . MCH 03/08/2020 31.8  26.0 - 34.0 pg Final  . MCHC 03/08/2020 34.0  30.0 - 36.0 g/dL Final  . RDW 30/03/6225 13.0  11.5 - 15.5 % Final  . Platelets 03/08/2020 245  150 - 400 K/uL  Final  . nRBC 03/08/2020 0.0  0.0 - 0.2 % Final  . Neutrophils Relative % 03/08/2020 59  % Final  . Neutro Abs 03/08/2020 3.8  1.7 - 7.7 K/uL Final  . Lymphocytes Relative 03/08/2020 29  % Final  . Lymphs Abs 03/08/2020 1.8  0.7 - 4.0 K/uL Final  . Monocytes Relative 03/08/2020 8  % Final  . Monocytes Absolute 03/08/2020 0.5  0 - 1 K/uL Final  . Eosinophils Relative 03/08/2020 2  % Final  .  Eosinophils Absolute 03/08/2020 0.1  0 - 0 K/uL Final  . Basophils Relative 03/08/2020 1  % Final  . Basophils Absolute 03/08/2020 0.0  0 - 0 K/uL Final  . Immature Granulocytes 03/08/2020 1  % Final  . Abs Immature Granulocytes 03/08/2020 0.03  0.00 - 0.07 K/uL Final   Performed at [facility], [address] [place], [place] [address]  . Acetaminophen (Tylenol), Serum 03/08/2020 <10* 10 - 30 ug/mL Final   Comment: (NOTE) Therapeutic concentrations vary significantly. A range of 10-30 ug/mL  may be an effective concentration for many patients. However, some  are best treated at concentrations outside of this range. Acetaminophen concentrations >150 ug/mL at 4 hours after ingestion  and >50 ug/mL at 12 hours after ingestion are often associated with  toxic reactions.  Performed at [facility], [address] [place], [place] [address]   . Salicylate Lvl 03/08/2020 <7.0* 7.0 - 30.0 mg/dL Final   Performed at [facility], [address] [place], [place] [address]  Admission on 03/07/2020, Discharged on 03/08/2020  Component Date Value Ref Range Status  . SARS Coronavirus 2 03/07/2020 NEGATIVE  NEGATIVE Final   Comment: (NOTE) SARS-CoV-2 target nucleic acids are NOT DETECTED.  The SARS-CoV-2 RNA is generally detectable in upper and lower respiratory specimens during the acute phase of infection. The lowest concentration of SARS-CoV-2 viral copies this assay can detect is 250 copies / mL. A negative result does not preclude SARS-CoV-2  infection and should not be used as the sole basis for treatment or other patient management decisions.  A negative result may occur with improper specimen collection / handling, submission of specimen other than nasopharyngeal swab, presence of viral mutation(s) within the areas targeted by this assay, and inadequate number of viral copies (<250 copies / mL). A negative result must be combined with clinical observations, patient history, and epidemiological information.  Fact Sheet for Patients:   BoilerBrush.com.cyhttps://www.fda.gov/media/136312/download  Fact Sheet for Healthcare Providers: https://pope.com/https://www.fda.gov/media/136313/download  This test is not yet approved or                           cleared by the [place] FDA and has been authorized for detection and/or diagnosis of SARS-CoV-2 by FDA under an Emergency Use Authorization (EUA).  This EUA will remain in effect (meaning this test can be used) for the duration of the COVID-19 declaration under Section 564(b)(1) of the Act, 21 U.S.C. section 360bbb-3(b)(1), unless the authorization is terminated or revoked sooner.  Performed at [facility], [address] [place], [place] [address]     Allergies: Patient has no known allergies.  PTA Medications: (Not in a hospital admission)   Medical Decision Making   Patient admitted to continuous assessment; Peer support ordered to assist patient with rehab and substance abuse services  Recommendations  Based on my evaluation the patient does not appear to have an emergency medical condition.  Jovanna , NP 03/12/20  1:15 PM

## 2020-03-12 NOTE — ED Notes (Signed)
Pt ambulated to restroom without difficulty. Sitter remains at bedside with the pt.

## 2020-03-12 NOTE — ED Notes (Signed)
Pt discharged in no acute distress. Verbalized understanding of all discharge instructions to include f/u care at [facility] today. All patient belongings returned to pt intact to include RX given

## 2020-03-12 NOTE — ED Notes (Signed)
Pt resting in room at this time. Sitter at bedside. Equal rise and fall of chest noted. NAD noted. Will continue to monitor.

## 2020-03-12 NOTE — ED Notes (Signed)
Attempted to call [facility] to give report @ [phone]. No answer.

## 2020-03-12 NOTE — ED Notes (Signed)
Spoke to [employer] and they are now ready for patient. Transportation called.

## 2020-03-12 NOTE — ED Notes (Signed)
Patient discharged in no acute distress. Denies SI/HI. Verbalized understanding of all discharge instructions reviewed by RN to include F/u care at [facility] today. Pt verbalized understanding of medications given from Pharmacy. Safety maintained. Pt escorted to lobby for self transport to [facility]. Patient given a print out of directions to facility.
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
## 2020-03-12 NOTE — ED Notes (Signed)
Transport here to take patient to [facility].

## 2020-03-12 NOTE — [facility] Counselor (Signed)
  Per Charge Nurse and Nurse Victorino Dike:  TTS attempted to do pt's assessment at 2:30am, pt is asleep and all nighttime meds had been given. Nurse to call TTS when pt can be assessed.

## 2020-03-12 NOTE — ED Notes (Signed)
Telepsych to bedside. 

## 2020-03-12 NOTE — ED Notes (Signed)
Pt has slept throughout last 4 hours, went to [facility] x 1. No complaints

## 2020-03-12 NOTE — Progress Notes (Signed)
Received Tim Morales from TTS, he was given a snack of his choice and seconds. Afterwards he drifted off to sleep.He denied suicidal ideations at the present time.

## 2020-05-06 DIAGNOSIS — I6389 Other cerebral infarction: Secondary | ICD-10-CM | POA: Diagnosis not present

## 2020-05-21 IMAGING — CT CT ABD-PELV W/O CM
2 of 4 series · 17 of 46 positions shown, 19 images · non-contrast
Comparison: 09/06/2019

CLINICAL DATA: Left lower quadrant pain

EXAM:
CT ABDOMEN AND PELVIS WITHOUT CONTRAST
TECHNIQUE: Multidetector CT imaging of the abdomen and pelvis was performed
following the standard protocol without IV contrast.

[Series 2: axial st · axial · 0.80mm/px · z∈[-632,-232]mm · 14 of 92 slices shown, 16 images]
[im 6/92  soft-tissue]
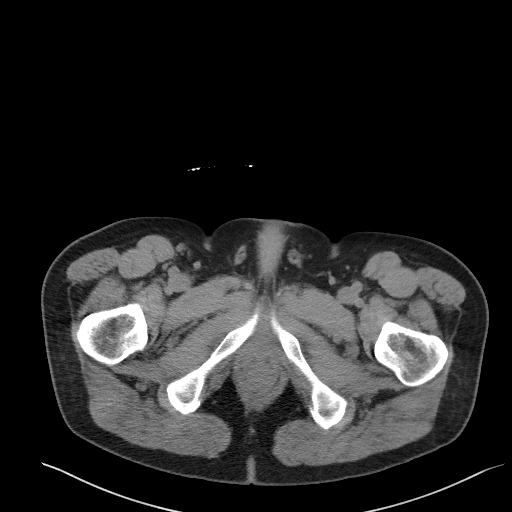
[im 6/92  bone]
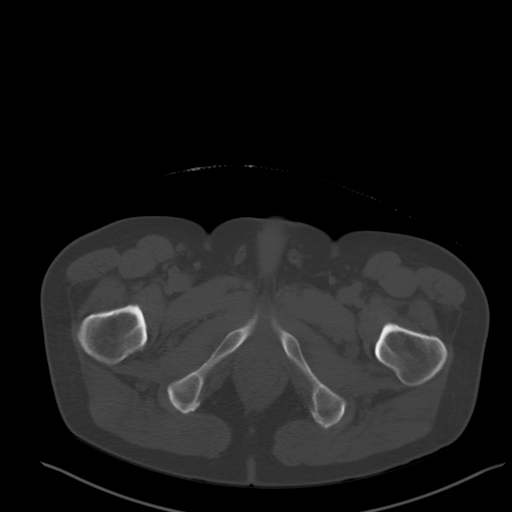
[im 12/92  soft-tissue]
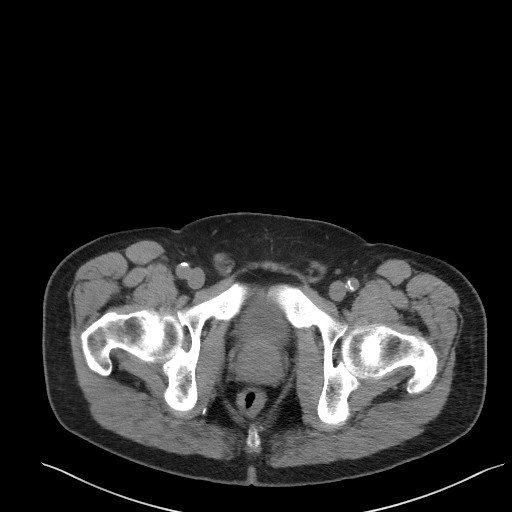
[im 18/92  soft-tissue]
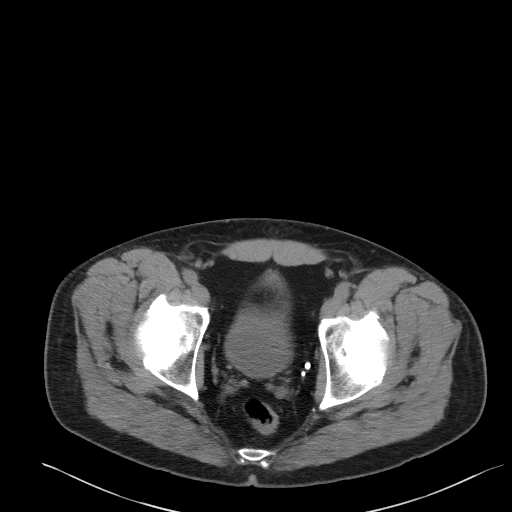
[im 23/92  soft-tissue]
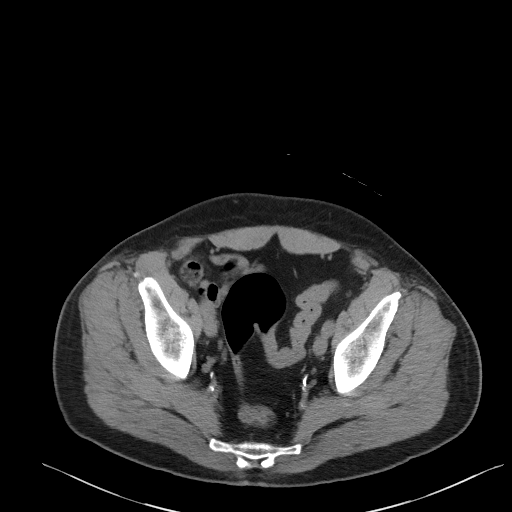
[im 29/92  soft-tissue]
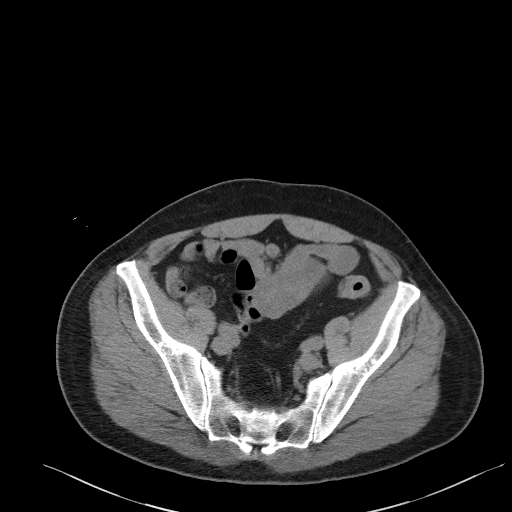
[im 35/92  soft-tissue]
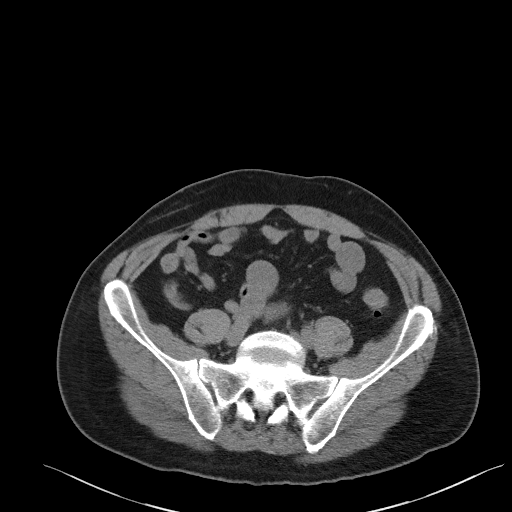
[im 40/92  soft-tissue]
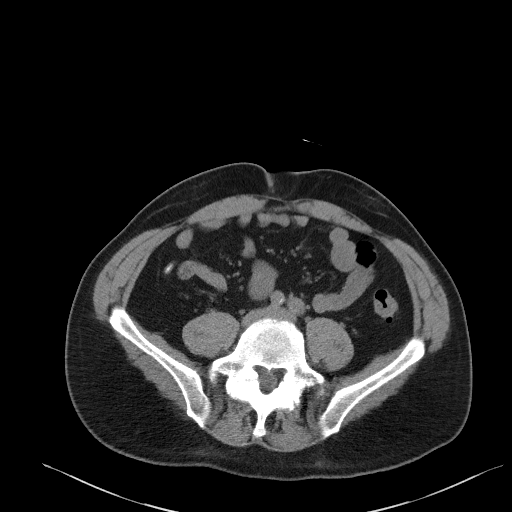
[im 52/92  soft-tissue]
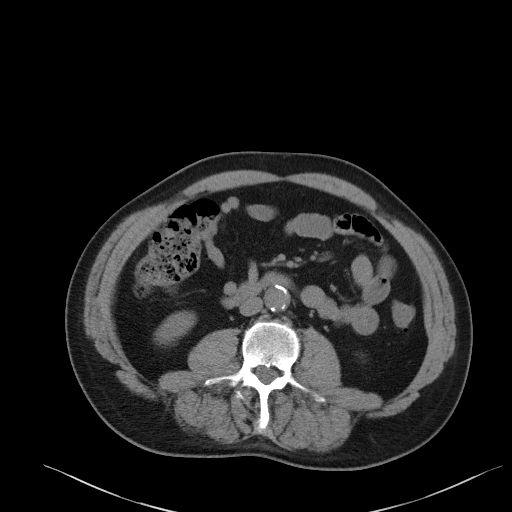
[im 57/92  soft-tissue]
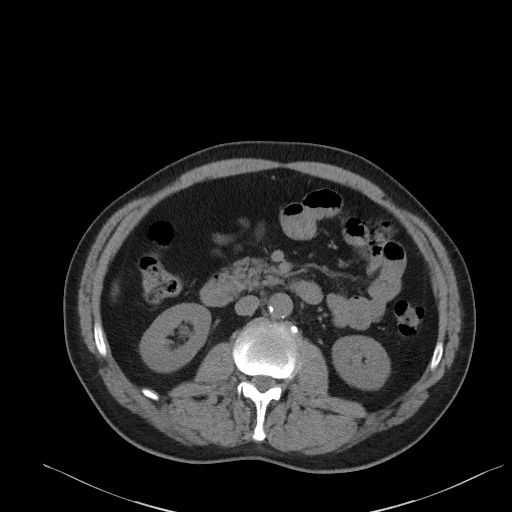
[im 57/92  bone]
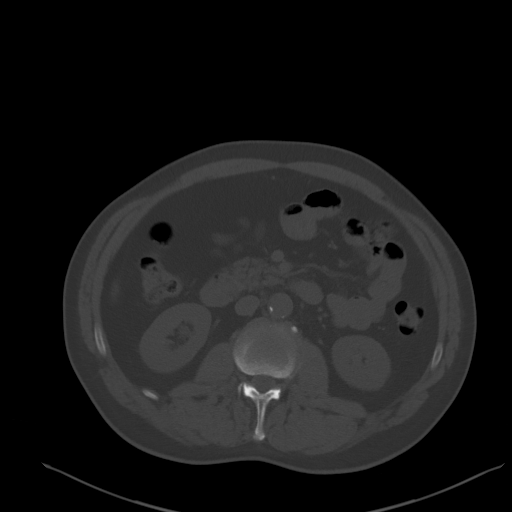
[im 63/92  soft-tissue]
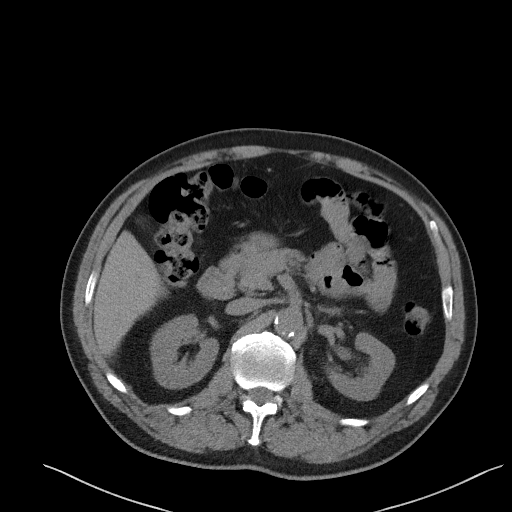
[im 69/92  soft-tissue]
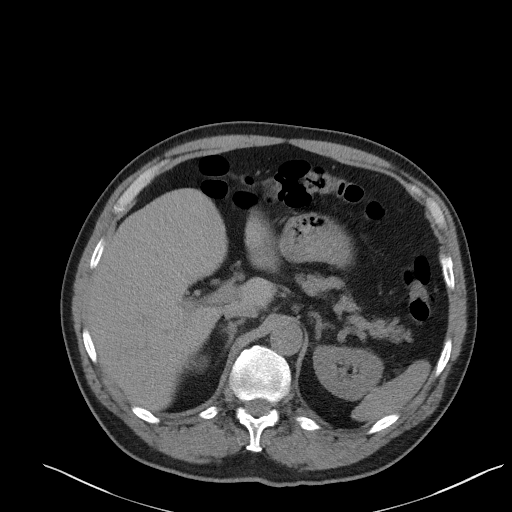
[im 74/92  soft-tissue]
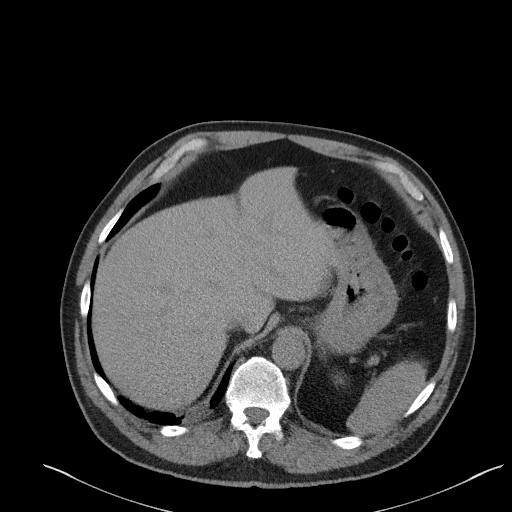
[im 80/92  soft-tissue]
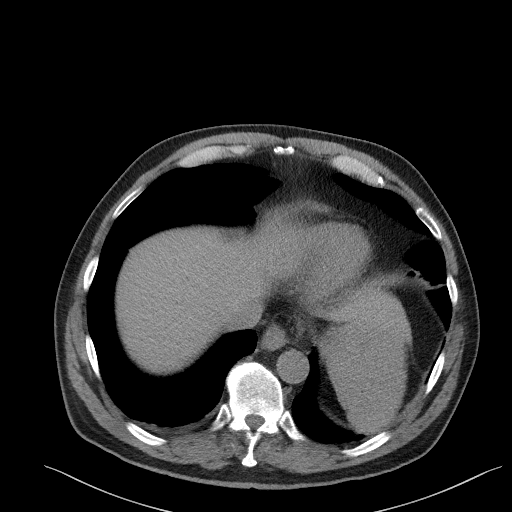
[im 86/92  soft-tissue]
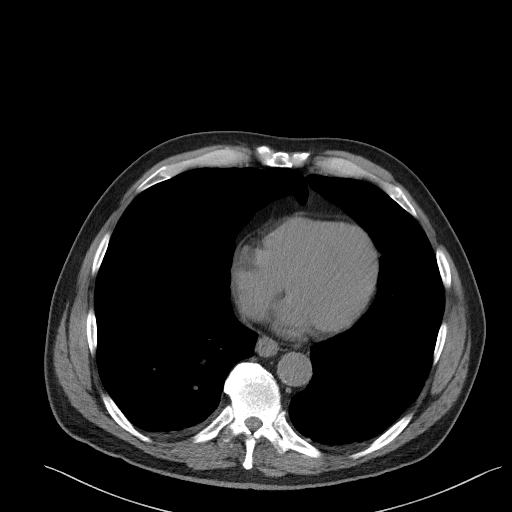

[Series 4: coronal st · coronal · 1.03mm/px · 3 of 90 slices shown]
[im 30/90  soft-tissue]
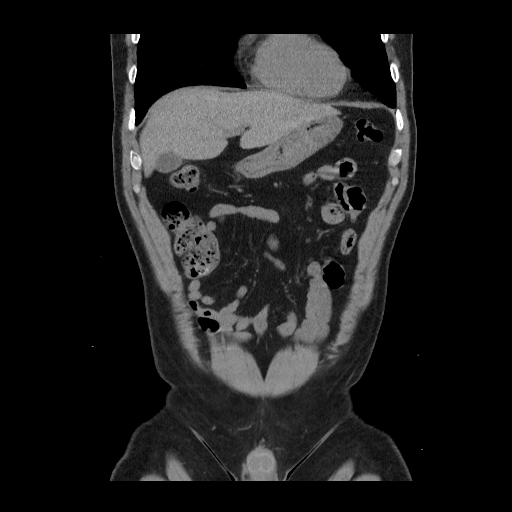
[im 40/90  soft-tissue]
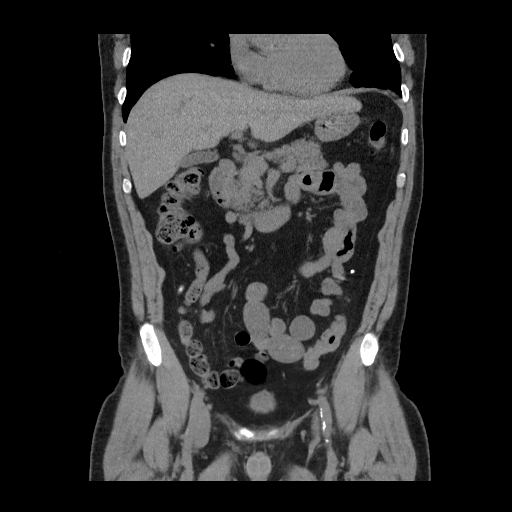
[im 50/90  soft-tissue]
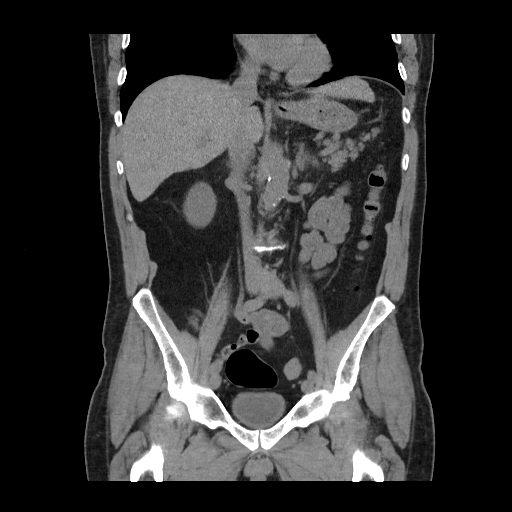

[17 of 46 positions shown; findings below may reference images not displayed]

FINDINGS: Lower chest: Minimal basilar atelectasis is noted.

Hepatobiliary: No focal liver abnormality is seen. No gallstones,
gallbladder wall thickening, or biliary dilatation.

Pancreas: Unremarkable. No pancreatic ductal dilatation or
surrounding inflammatory changes.

Spleen: Normal in size without focal abnormality.

Adrenals/Urinary Tract: Adrenal glands are within normal limits.
Kidneys are well visualized bilaterally. No renal calculi or
obstructive changes are seen. The bladder is partially distended.

Stomach/Bowel: The colon again demonstrates some mild diverticular
change without evidence of diverticulitis. The appendix is within
normal limits. The small bowel shows no obstructive or inflammatory
changes. Stomach is decompressed.

Vascular/Lymphatic: Aortic atherosclerosis. No enlarged abdominal or
pelvic lymph nodes.

Reproductive: Prostate is unremarkable.

Other: No abdominal wall hernia or abnormality. No abdominopelvic
ascites.

Musculoskeletal: No acute bony abnormality is noted. L5 pars defects
are again seen.
IMPRESSION: Mild bibasilar atelectasis.

Diverticulosis without diverticulitis stable from the prior exam.

No acute abnormality is noted to correspond with the patient's given
clinical symptomatology.
